# Patient Record
Sex: Male | Born: 1950 | Hispanic: No | Marital: Married | Smoking: Never smoker
Health system: Southern US, Community
[De-identification: ages and names within clinical notes are randomized; demographics above are authoritative.]

## PROBLEM LIST (undated history)

## (undated) DIAGNOSIS — I712 Thoracic aortic aneurysm, without rupture, unspecified: Secondary | ICD-10-CM

## (undated) DIAGNOSIS — I723 Aneurysm of iliac artery: Secondary | ICD-10-CM

## (undated) DIAGNOSIS — I10 Essential (primary) hypertension: Secondary | ICD-10-CM

## (undated) DIAGNOSIS — Z9889 Other specified postprocedural states: Secondary | ICD-10-CM

## (undated) HISTORY — DX: Aneurysm of iliac artery: I72.3

## (undated) HISTORY — DX: Thoracic aortic aneurysm, without rupture: I71.2

## (undated) HISTORY — DX: Essential (primary) hypertension: I10

## (undated) HISTORY — DX: Thoracic aortic aneurysm, without rupture, unspecified: I71.20

## (undated) HISTORY — DX: Other specified postprocedural states: Z98.890

---

## 1994-12-04 HISTORY — PX: OTHER SURGICAL HISTORY: SHX169

## 2000-10-20 ENCOUNTER — Encounter: Payer: Self-pay | Admitting: Emergency Medicine

## 2000-10-20 ENCOUNTER — Emergency Department (HOSPITAL_COMMUNITY): Admission: EM | Admit: 2000-10-20 | Discharge: 2000-10-20 | Payer: Self-pay | Admitting: Emergency Medicine

## 2003-05-31 ENCOUNTER — Ambulatory Visit (HOSPITAL_BASED_OUTPATIENT_CLINIC_OR_DEPARTMENT_OTHER): Admission: RE | Admit: 2003-05-31 | Discharge: 2003-05-31 | Payer: Self-pay | Admitting: Otolaryngology

## 2003-08-19 ENCOUNTER — Ambulatory Visit (HOSPITAL_COMMUNITY): Admission: RE | Admit: 2003-08-19 | Discharge: 2003-08-19 | Payer: Self-pay | Admitting: Gastroenterology

## 2006-12-04 DIAGNOSIS — Z9889 Other specified postprocedural states: Secondary | ICD-10-CM

## 2006-12-04 HISTORY — DX: Other specified postprocedural states: Z98.890

## 2007-04-05 ENCOUNTER — Encounter: Admission: RE | Admit: 2007-04-05 | Discharge: 2007-04-05 | Payer: Self-pay | Admitting: Cardiovascular Disease

## 2007-04-09 ENCOUNTER — Ambulatory Visit (HOSPITAL_COMMUNITY): Admission: RE | Admit: 2007-04-09 | Discharge: 2007-04-09 | Payer: Self-pay | Admitting: Cardiovascular Disease

## 2007-04-12 ENCOUNTER — Ambulatory Visit (HOSPITAL_COMMUNITY): Admission: RE | Admit: 2007-04-12 | Discharge: 2007-04-12 | Payer: Self-pay | Admitting: Cardiovascular Disease

## 2007-05-16 ENCOUNTER — Ambulatory Visit: Payer: Self-pay | Admitting: Cardiothoracic Surgery

## 2007-07-15 ENCOUNTER — Encounter (INDEPENDENT_AMBULATORY_CARE_PROVIDER_SITE_OTHER): Payer: Self-pay | Admitting: Urology

## 2007-07-15 ENCOUNTER — Inpatient Hospital Stay (HOSPITAL_COMMUNITY): Admission: RE | Admit: 2007-07-15 | Discharge: 2007-07-17 | Payer: Self-pay | Admitting: Urology

## 2007-09-27 ENCOUNTER — Encounter: Admission: RE | Admit: 2007-09-27 | Discharge: 2007-09-27 | Payer: Self-pay | Admitting: Orthopedic Surgery

## 2007-11-21 ENCOUNTER — Ambulatory Visit: Payer: Self-pay | Admitting: Cardiothoracic Surgery

## 2007-11-21 ENCOUNTER — Encounter: Admission: RE | Admit: 2007-11-21 | Discharge: 2007-11-21 | Payer: Self-pay | Admitting: Cardiothoracic Surgery

## 2008-01-27 ENCOUNTER — Encounter: Admission: RE | Admit: 2008-01-27 | Discharge: 2008-01-27 | Payer: Self-pay | Admitting: Urology

## 2008-11-19 ENCOUNTER — Ambulatory Visit: Payer: Self-pay | Admitting: Cardiothoracic Surgery

## 2010-12-25 ENCOUNTER — Encounter: Payer: Self-pay | Admitting: Cardiothoracic Surgery

## 2011-04-18 NOTE — Consult Note (Signed)
NEW PATIENT CONSULTATION   Michael Barron, Michael Barron  DOB:  09-04-1951                                        May 16, 2007  CHART #:  [id number]   CONSULTATION:  This is an office consultation requested by Dr. Nanetta Batty.   Followup cardiologist is Dr. Allyson Sabal.  Primary care physician is Dr.  Larina Bras.  Urologist is Dr. Earlene Plater.   REASON FOR CONSULTATION:  Dilated aortic root.   HISTORY OF PRESENT ILLNESS:  Patient is a 60 year old male who comes to  the office for a dilated aortic root after a convoluted series of  studies incidentally revealed this.  Initially the patient, because of  his new diagnosis of diabetes, history of hypertension, discussed with  his primary care physician whether he should have a stress test.  A  Myoview stress test was performed by Dr. Allyson Sabal, which was suspicious for  leading to a cardiac catheterization because of a moderate-sized scar in  the right coronary distribution.  Cardiac catheterization was performed  by Dr. Allyson Sabal which showed a mildly dilated ascending aorta, which then  led to a CT scan of the chest, abdomen and pelvis.  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  The aorta was dilated to 4.5 cm just above the aortic  root.  Because of this dilatation of the ascending aorta, the patient  was referred to see me.   CARDIAC RISK FACTORS:  Patient has a history of hypertension, history of  hyperlipidemia with total cholesterol of 299, history of newly diagnosed  type 2 diabetes, hemoglobin A1c as high as 10, most recent one is 7.  No  history of smoking.   FAMILY HISTORY:  Significant for father who died at age 43 of liver  carcinoma.  Mother died at age 68 of uterine cancer.  One sister died at  age 73 with breast cancer metastatic to the liver.   Patient has had no previous stroke.  Denies  claudication.  Denies renal  insufficiency.  Denies any other medical problems.   PAST SURGICAL HISTORY:  Previous surgery history includes a herniated  disk, cervical, requiring surgery on C5-6 in 1996.  History of  tonsillectomy in the past.   SOCIAL HISTORY:  Patient is married, employed as a Financial planner,  primarily works at [employer] work.  Has occasional alcohol use, not daily.   MEDICATIONS:  He is currently on Diovan 160/12.5, verapamil 360 mg,  metformin 500 mg twice a day, Glyburide 10 mg a day, Nexium p.r.n.,  simvastatin 20 mg a day, Lopressor 25 mg a day, __________ one gram per  day, and one aspirin a day.   ALLERGIES:  TRI-COR which caused rash after 1-2 days of use, also rash  with AMOXICILLIN and MINOCYCLINE.   REVIEW OF SYSTEMS:  CARDIAC:  He does have atypical chest discomfort  especially on the left lower chest.  Denies resting shortness of breath.  Does have exertional shortness of breath.  Denies orthopnea.  Has  occasional presyncopal episodes.  No history of syncope.  No history of  palpitations.  No lower extremity edema.  GENERAL:  Since the diagnosis  of diabetes patient has been making an effort  to lose weight.  He has  gone from 309 down to 245.  OTHER:  He does have reflux symptoms.  Denies blood in his stool.  Denies black stools.  Has had some history  of lightheadedness and dizziness.  No syncope or seizures.  Does  complain of arthritis.  Denies psychiatric history.  Denies amaurosis or  TIAs.  He has had no blood in his urine.  He did note spontaneous  bruising on the left eyelid over the past several days.   PHYSICAL EXAMINATION:  VITAL SIGNS:  Blood pressure 104/68, pulse 56 and  regular, respiratory rate 18, O2 sat is 98%.  GENERAL:  Patient is awake, alert, neurologically intact.  NECK:  He has no carotid bruits.  LUNGS:  Clear bilaterally.  CARDIAC:  Exam reveals regular rate at approximately 60 without murmur  or gallop.  ABDOMEN:  Benign,  without palpable masses.  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  EXTREMITIES:  He has no pedal edema.  Pedal pulses are 2+ bilaterally.   The patient's CT scan findings and cath labs findings are reviewed.   IMPRESSION:  1. Patient with suspicious lesion, 2 cm, for carcinoma of the left      kidney.  2. Very small, approximately 3-mm, smooth nodule in the right lung,      too small to characterize, but in the setting of possible renal      cell carcinoma may be of significance.  Patient has a slightly      dilated ascending aorta with a maximum diameter of 4.5 without      evidence of aortic insufficiency.   SUGGESTIONS:  The patient over the last several months has heeded his  medical advice with good treatment of his diabetes, lost weight, and is  on blood pressure medication that appears to be effective.  At this  point, with ascending aorta of 4.5 cm, patient does not warrant major  surgical intervention for this at this time.  I have discussed with him  the potential risks of dilated ascending aorta with spontaneous  dissections and stressed the importance of good blood pressure control  with some beta blocker.  At this point the greatest concern may be his  left renal mass and this should proceed.  I have made the patient a  return appointment to see me with a followup CT scan of the chest in six  month to check on the size of his aorta and also to evaluate any change  in size of the approximately 3-mm nodule in the right lung.  I discussed  the abdominal iliac artery aneurysm with the patient but will leave it  up to Dr. Allyson Sabal to follow this or refer him to a vascular surgeon.   I will send a copy of this to Dr. Darvin Neighbours, who the patient is to see  in the coming weeks concerning his question of renal carcinoma.  I plan  to see him back again in six months with a followup CT scan of the  chest.   Sheliah Plane, MD  Electronically Signed   EG/MEDQ  D:   05/16/2007  T:  05/17/2007  Job:  [id number]   cc:   Nanetta Batty, M.D.  Ace Gins, MD  Lucrezia Starch. Earlene Plater, M.D.

## 2011-04-18 NOTE — Discharge Summary (Signed)
NAMECAROLINA, ANTOLIN NO.:  [id number]   MEDICAL RECORD NO.:  [id number]          PATIENT TYPE:  INP   LOCATION:  1427                         FACILITY:  [facility]   PHYSICIAN:  Heloise Purpura, MD      DATE OF BIRTH:  May 25, 1951   DATE OF ADMISSION:  07/15/2007  DATE OF DISCHARGE:  07/17/2007                               DISCHARGE SUMMARY   ADMISSION DIAGNOSIS:  Left renal mass.   DISCHARGE DIAGNOSIS:  Left renal mass.   HISTORY:  For full details, please see admission history and physical.  Briefly, Mr. Bauserman is a 60 year old gentleman who was found to have  an incidentally detected left renal mass.  He underwent a metastatic  evaluation which was negative.  After discussing options, he elected to  proceed with a minimally invasive partial nephrectomy.   HOSPITAL COURSE:  On 07/15/2007, the patient was taken to the operating  room and underwent a robotic assisted left laparoscopic partial  nephrectomy.  He tolerated this procedure well and without  complications.  Postoperatively, he was able to be transferred to a  regular hospital room following recovery from anesthesia.  He was  maintained on strict bedrest for 24 hours and remained hemodynamically  stable.  His hematocrit on postoperative day #1 was found to be stable  at 37.8.  He then began ambulating, and his Foley catheter was  discontinued.  He maintained excellent urine output with minimal output  from his perinephric drain.  His diet was gradually advanced until he  was tolerating a regular diet on postoperative day #2.  His perinephric  drain fluid was sent for a creatinine level and was found to be  consistent with serum at 1.3.  It was therefore removed.  The patient  did have return of bowel function and was able to be discharged home in  excellent condition on postoperative day #2.   DISPOSITION:  Home.   DISCHARGE MEDICATIONS:  The patient was instructed to resume his regular  home  medications except for his aspirin and multivitamins.  He was given  a prescription to take Vicodin as needed for pain as well as to use  Colace as a stool softener.   DISCHARGE INSTRUCTIONS:  The patient was instructed to be ambulatory but  specifically told to refrain from any heavy lifting, strenuous activity,  or driving.  He was instructed on signs and symptoms of wound infection  and told to call, should he have any problems.   FOLLOW UP:  Mr. Counsell will follow-up in 2 weeks to discuss his  surgical pathology in detail and for removal of staples.      Heloise Purpura, MD  Electronically Signed    LB/MEDQ  D:  07/17/2007  T:  07/18/2007  Job:  [id number]

## 2011-04-18 NOTE — Op Note (Signed)
NAMEJASHUA, KNAAK NO.:  [id number]   MEDICAL RECORD NO.:  [id number]          PATIENT TYPE:  INP   LOCATION:  1427                         FACILITY:  [facility]   PHYSICIAN:  Heloise Purpura, MD      DATE OF BIRTH:  Oct 01, 1951   DATE OF PROCEDURE:  07/15/2007  DATE OF DISCHARGE:                               OPERATIVE REPORT   PREOPERATIVE DIAGNOSIS:  Left renal mass.   POSTOPERATIVE DIAGNOSIS:  Left renal mass.   PROCEDURE:  Robotic assisted laparoscopic left partial nephrectomy.   SURGEON:  Dr. Heloise Purpura.   ASSISTANT:  Dr. Tarri Glenn.   ANESTHESIA:  General.   COMPLICATIONS:  None.   ESTIMATED BLOOD LOSS:  175 mL.   INTRAVENOUS FLUIDS:  2750 mL of lactated Ringer's.   INTRAOPERATIVE FINDINGS:  Renal parenchymal margins were negative.   Warm ischemia time 23 minutes.   PATHOLOGIC SPECIMEN:  1. Renal parenchymal margin.  2. Left renal mass.   DISPOSITION:  Specimen to pathology.   DRAINS:  1. 16-French Foley catheter.  2. #15 Blake perinephric drain.   INDICATIONS:  Mr. Penick is a 60 year old gentleman who was found to  have an incidental 2 cm enhancing left renal mass.  He underwent  metastatic evaluation which was negative.  After discussing management  options for clinically localized small renal masses, the patient elects  to proceed with surgical removal and a minimally invasive partial  nephrectomy.  After discussing the risks, potential complications and  alternative management options, the patient elected to proceed with the  above procedure.  Informed consent was obtained.   DESCRIPTION OF PROCEDURE:  The patient was taken to the operating room  and a general anesthetic was administered.  He is given preoperative  antibiotics, placed in the left modified flank position, prepped and  draped in the usual sterile fashion.  Next a preoperative time-out was  performed.  A site was then selected to the left of the umbilicus for  placement of the camera port.  This was placed using a standard open  Hasson technique.  This allowed entry into the peritoneal cavity under  direct vision without difficulty.  A 12 mm port was then placed and a  pneumoperitoneum was established.  A 0 degrees lens was then used to  inspect the abdomen and there is no evidence of any intra-abdominal  injuries or other abnormalities.  The remaining ports were then placed.  An 8 mm robotic port was placed in the left upper quadrant.  An  additional 8 mm robotic port was then placed in the left lower quadrant.  An additional 8 mm robotic port was then placed just inferior to the  camera port and medial to the port in the left lower quadrant.  A 12 mm  port was placed for laparoscopic assistance between the camera port and  the left upper quadrant port.  All ports placed under direct vision and  without difficulty.  The 30 degrees lens was then utilized and the  surgical cart was docked.  With the aid of cautery scissors, the white  line  of Toldt was incised along the length of the descending colon  allowing the space between the colonic mesentery and the anterior layer  of Gerota's fascia to be developed.  The ureter and gonadal vein were  identified and lifted anteriorly off the psoas muscle and dissection  continued toward the renal hilum.  The patient was noted on preoperative  imaging to have two renal arteries.  On inspection of his hilum, there  was noted to be a small renal artery that appeared to be much more  inferior then the renal hilar arteries that were noted on his  preoperative imaging.  Just superior to this artery was noted to be a  large lumbar vein as well as an additional gonadal vein.  These were  ligated between multiple Hem-o-lok clips.  This allowed dissection up to  the main renal vein which was identified.  Posterior to the main renal  vein, two renal arteries were identified as seen on preoperative  imaging.  These  arteries were isolated and prepared for renal hilar  clamping.  12.5 grams of intravenous mannitol was then administered.  Attention then turned to the kidney and Gerota's fascia was incised  allowing the renal parenchyma to be exposed.  The patient's renal tumor  off the lateral aspect of the upper pole was easily identifiable.  Once  the perinephric fat along the edges of the tumor was removed and the  tumor isolated, the renal capsule was scored circumferentially around  the tumor.  A Surgicel bolster and 2-0 Vicryl suture were placed into  the abdomen in preparation for renal parenchymal repair.  At this point  bulldog clamps were placed on the two superior arteries of the kidney.  Using the round tip scissors, the renal tumor was then excised with a  grossly negative parenchymal margin.  The tumor was then placed in the  Endopouch retrieval bag for later removal.  The renal defect was then  examined.  Two deep margins were removed for intraoperative analysis and  returned negative for malignancy subsequently.  A figure-of-eight 4-0  Vicryl suture was placed to secure any bleeding vessels.  These sutures  were secured with a Laparotie.  A Surgicel bolster which had been  previously placed into the abdomen was then placed into the defect.  A 2-  0 Vicryl suture was then used to go ahead and reapproximated the renal  capsule over the Surgicel bolster.  Prior to securing the bolster, 4 mL  of was placed into the parenchymal defect for hemostatic purposes.  There appeared to be good compression of the renal parenchyma and  hemostasis appeared excellent.  The bulldog clamps were then removed.  Total warm ischemia time was 23 minutes.  12.5 grams of intravenous  mannitol was then again administered.  The renal hilum was examined and  there appeared to be no evidence of any bleeding.  The resected tumor  site was also examined and hemostasis appeared excellent.  Additional  Vitagel was placed  over the defect and the perinephric fat was  reapproximated over the resection site with remaining 2-0 Vicryl suture.  All needle, all needles were removed from the abdomen.  A #15 Blake  drain was brought to the left lower quadrant robotic port appropriately  positioned in the perinephric space and secured to skin with a nylon  suture.  The pneumoperitoneum was let down and hemostasis remained  excellent.  The surgical cart was then undocked and preparations were  made for closure.  The two 12 mm port sites were closed with 0 Vicryl  sutures placed with the aid of the suture passer device as well as  figure-of-eight 0 Vicryl sutures on the anterior fascia.  All ports were  removed under direct vision.  The pneumoperitoneum was expelled.  0.25%  Marcaine  was injected into all incision sites which were then reapproximated at  the skin level with staples.  Sterile dressings were applied.  The  patient appeared to tolerate the procedure well without complications.  He was able to be extubated and transferred to the recovery unit in  satisfactory condition.      Heloise Purpura, MD  Electronically Signed     LB/MEDQ  D:  07/15/2007  T:  07/16/2007  Job:  [id number]

## 2011-04-18 NOTE — Cardiovascular Report (Signed)
NAME:  ZYHEIR, DAFT NO.:  [id number]   MEDICAL RECORD NO.:  [id number]          PATIENT TYPE:  OIB   LOCATION:  2854                         FACILITY:  [facility]   PHYSICIAN:  Nanetta Batty, M.D.   DATE OF BIRTH:  24-Mar-1951   DATE OF PROCEDURE:  DATE OF DISCHARGE:                            CARDIAC CATHETERIZATION   Michael Barron is a 60 year old moderately overweight married white male  father of one, grandfather of two grandchildren, referred by [facility] (Dr. Ace Gins).  He had a nuclear stress test in  our office performed April 10.  This showed inferolateral scar with mild  peri-infarct ischemia.  His risk factors include hypertension, diabetes  and hyperlipidemia.  He has noticed chest pain at approximately 1 to 2  times a month.  He presents now for outpatient diagnostic coronary  arteriography to define his anatomy and rule out an ischemic etiology.   DESCRIPTION OF PROCEDURE:  The patient was brought to the second floor,  [facility] in a postabsorptive state.  He was pre-  medicated with p.o. Valium.  His right groin was prepped and shaved in  the usual sterile fashion.  Xylocaine 1% was used for  local anesthesia.  A 6-French sheath was inserted to the right femoral artery using  standard Seldinger technique.  A 6-French right and left Judkins'  diagnostic catheter, as well as 6-French pigtail catheter and IMA  catheter were used for selective coronary angiography, left  ventriculography, supravalvular aortography, distal abdominal  aortography and selective left iliac angiography.  Visipaque dye was  used for the entirety of the case.  Retrograde aortic, left ventricular  and pulmonary pressures were recorded.   HEMODYNAMIC RESULTS:  1. Aortic systolic pressure 135, diastolic pressure 61.  2. Left ventricular systolic pressure 133, end-diastolic pressure 16.   SELECTIVE CORONARY ANGIOGRAPHY:  1. Left main  normal.  2. LAD normal.  3. Left circumflex normal.  4. Right coronary artery was dominant and normal.  5. Left ventriculography; RAO left ventriculogram was performed using      25 mL of Visipaque dye at 12 mL per second.  The overall was LVEF      estimated approximately 50% with mild global hypokinesia.  6. Supravalvular aortography; performed in the LAO view using 3 mL of      Visipaque dye at 20 mL per second.  The aortic root appeared      moderately dilated.  There as no AI.  It narrowed down in the      transverse arch, past the left common carotid.  Arch vessels      appeared intact.  7. Distal abdominal aortography.  Distal abdominal aortogram was      performed using 20 mL of Visipaque dye at 20 mL per second.  The      large was widely patent.  Infra-renal abdominal aorta was somewhat      tortuous, as with the iliac arteries.  8. Selective angiography was performed of the left iliac, revealing an      egg-shaped mid-left common iliac artery  aneurysm.   IMPRESSION:  Michael Barron has essentially normal coronary arteries, low  normal LV function and a Myoview that was probably abnormal, related to  diaphragmatic attenuation.  I am somewhat concerned about his aortic  root, as well as his left common iliac.  He will need CT angio to his  accurately size these.  Continued medical therapy will be recommended.   Sheath was removed and pressure was held on the groin to achieve  hemostasis.  The patient left the lab in stable condition.      Nanetta Batty, M.D.  Electronically Signed     JB/MEDQ  D:  04/09/2007  T:  04/09/2007  Job:  [id number]   cc:   Patient Chart  [facility] -2nd Floor  [facility]  Ace Gins, MD  [facility]

## 2011-04-18 NOTE — Assessment & Plan Note (Signed)
OFFICE VISIT   BABY, STAIRS  DOB:  [birth year]                                        November 19, 2008  CHART #:  [id number]   The patient comes to the office today.  I had originally seen in June  2008 when he was referred by Dr. Allyson Sabal for dilated ascending aorta.  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  His ascending  aorta was dilated to 4.5 cm.  Since originally seen, he has made major  efforts at weight reduction, losing weight from 310 down to average of  195 pounds.  In addition, he had his renal tumor resected.  He returns  today because in last appointment I saw him, I suggested a followup scan  to evaluate the rate of change of his ascending aorta.  He recently had  a CT scan done by the urologist done August 25, 2008, primarily to  evaluate for his followup for renal cell tumor, but a CT scan of the  chest was performed and the ascending aorta was measured by Radiology at  4.5 cm.  Unfortunately, actually view and images is locked in to the  Urology system, which I do not have access to, but a detailed report  from Radiology reveals a maximum diameter of 4.5 cm ascending aorta  which is unchanged and the maximum diameter of the left common iliac  artery is unchanged at 2.5 cm.  No other question of lung nodules and  the previous CT scan were unchanged.  In a vague area on the posterior  aspect of the left fourth rib was thought to have a benign appearance by  report.   Overall, the patient feels very well.  Notes that he feels better than  he has for many years with his weight loss.  Denies any angina or  evidence of heart failure.   PHYSICAL EXAMINATION:  VITAL SIGNS:  His blood pressure is 148/87, pulse  is 63, respiratory rate is 18, and O2 sats 97%.  LUNGS:  Clear bilaterally.  CARDIAC:  Regular rate and rhythm without murmur.  I do not  hear any  murmur of aortic insufficiency.  ABDOMEN:  Benign.  The left iliac aneurysm is not palpable.  EXTREMITIES:  He has pedal pulses.  No edema.   As noted above, he continues on verapamil 180 mg.  He is on lisinopril,  but was unsure of the dose, but admits that he does not take it on a  regular basis; also on aspirin 325 mg a day.  He is not on a beta-  blocker.   From the CT scan report from urologist's office CT appears that the  patient's ascending aorta is unchanged since he was originally seen and  is well within the range to continue to observe it rather than setting  up a definite scan in 1 year.  I have made him an appointment to see me  back in 1 year to reevaluate his overall situation and then decide on  timing of any scans that need to be done and we will try to coordinate  this with Urology, so duplicate scans were not performed.   Sheliah Plane, MD  Electronically Signed   EG/MEDQ  D:  11/19/2008  T:  11/19/2008  Job:  [id number]   cc:   Nanetta Batty, M.D.  Ace Gins, MD  Heloise Purpura, MD

## 2011-04-18 NOTE — Assessment & Plan Note (Signed)
OFFICE VISIT   Michael Barron, Michael Barron  DOB:  04/10/1951                                        November 21, 2007  CHART #:  [id number]   Patient returns today with a follow-up CT scan of the chest.  He was  last seen in June, 2008, referred by Dr. Allyson Sabal for a dilated ascending  aorta.  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  The patient  was also significantly overweight.   Since last seen, he has undergone robotic partial nephrectomy, which he  has done well form.  He has also made a very major effort at weight  control and is now off diabetic medications and has decreased his waist  from 48 inches to 38 inches.   PHYSICAL EXAMINATION:  His blood pressure is 132/88.  Pulse is 70.  Respiratory rate is 18.  O2 sat is 97%.  Patient looks well.  He has no  murmur of aortic insufficiency.  His lungs are clear bilaterally.  Abdominal exam reveals healed port sites from his nephrectomy, without  palpable masses or tenderness.  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  It is not palpable on  physical examination.   Patient returns today with a follow-up CT scan of the chest done at [facility].  The maximum transverse diameter of the aorta is 43 mm.  A 5 mm nodule in  the right middle lobe on image #34 is noted.  The other patchy densities  likely are some pneumonitis.  This small nodule on the right lung  appears unchanged from previous scans.  The aorta is not changed in  size.   This patient has made a dramatic change in his lifestyle with weight  loss, diabetes control, and hypertension.  We will plan to have a follow-  up CT scan to evaluate the size of his aorta in one year.  The small  lung nodule noted on previous scans do not appear to be changing.  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  Dr. Allyson Sabal has been following the patient and will leave it up  to his discretion following this with vascular ultrasound and  referral to a vascular surgeon at his discretion.  Plan to see the  patient back in one year with a follow-up CT scan of the chest.   Sheliah Plane, MD  Electronically Signed   EG/MEDQ  D:  11/21/2007  T:  11/22/2007  Job:  [id number]   cc:   Nanetta Batty, M.D.  Heloise Purpura, MD  Ace Gins, MD

## 2011-04-18 NOTE — H&P (Signed)
NAMEADIL, TUGWELL NO.:  [id number]   MEDICAL RECORD NO.:  [id number]          PATIENT TYPE:  INP   LOCATION:  1427                         FACILITY:  [facility]   PHYSICIAN:  Heloise Purpura, MD      DATE OF BIRTH:  01-Sep-1951   DATE OF ADMISSION:  07/15/2007  DATE OF DISCHARGE:                              HISTORY & PHYSICAL   CHIEF COMPLAINT:  Left renal mass.   HISTORY:  Mr. Veal is a 60 year old gentleman who was found to have  an incidentally detected 2 cm enhancing left renal mass worrisome for  possible renal malignancy.  This was found on a CT angiogram that was  performed for further evaluation of an aortic aneurysm.  The patient's  thoracic aortic aneurysm was felt to be stable and after being evaluated  by Dr. Sheliah Plane, the decision was made to proceed with medical  management and surveillance of his aneurysm.  The patient also underwent  a cardiac evaluation by Dr. Nanetta Batty and the patient was felt to  be at low cardiovascular risk for surgical removal of his renal mass.  After discussing options and undergoing a negative metastatic  evaluation, the patient elects to proceed with a minimally invasive  partial nephrectomy.   PAST MEDICAL HISTORY:  1. Diabetes.  2. Obstructive sleep apnea.  3. Hyperlipidemia.  4. Hypertension.  5. Gastroesophageal reflux disease.  6. History of myocardial infarction.   PAST SURGICAL HISTORY:  1. Surgery for herniated cervical disk in 1997.  2. Excision of pilonidal cyst   MEDICATIONS:  1. Diovan  2. Fexofenadine  3. Glipizide.  4. Metformin.  5. Nexium.  6. Simvastatin.  7. Toprol  8. Verapamil   ALLERGIES:  1. PENICILLIN which causes a rash.  2. MINOCIN which causes a rash.  3. TRICOR which causes itching.   FAMILY HISTORY:  No history of GU malignancy.  There is a maternal  history of cervical cancer.  The patient's mother died at age 16 due to  cervical cancer.  He also  a sister  who died of breast cancer at a young  age.  The patient's father also died of liver cancer at age 66.   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  He is married.  He denies tobacco use.  Drinks  alcohol only occasionally.   REVIEW OF SYSTEMS:  Negative except for occasional fatigue, shortness of  breath, back pain, heartburn, dizziness and anxiety.  The patient also  has had significant weight loss intentionally over the last 10 months  and lost approximately 60 pounds.   PHYSICAL EXAM:  CONSTITUTIONAL:  Alert and oriented in no acute  distress.  CARDIOVASCULAR:  Regular rate and rhythm without obvious murmurs.  LUNGS:  Clear bilaterally.  ABDOMEN:  Mildly obese but soft, nontender, nondistended without  abdominal masses.  EXTREMITIES:  No edema.   IMPRESSION:  Left renal mass concerning for malignancy.   PLAN:  Mr. Kotowski will undergo a robotic assisted laparoscopic partial  nephrectomy of his left renal mass.  We have previously discussed the  risks and possible complications as well as the potential need for total  nephrectomy or open surgical conversion.  The patient will be admitted  to the hospital postoperatively for routine postoperative care.      Heloise Purpura, MD  Electronically Signed     LB/MEDQ  D:  07/15/2007  T:  07/15/2007  Job:  [id number]

## 2011-04-21 NOTE — Op Note (Signed)
   NAME:  Michael Barron, Michael Barron                         ACCOUNT NO.:  [id number]   MEDICAL RECORD NO.:  [id number]                   PATIENT TYPE:  AMB   LOCATION:  ENDO                                 FACILITY:  [facility]   PHYSICIAN:  Anselmo Rod, M.D.               DATE OF BIRTH:  08/03/1951   DATE OF PROCEDURE:  08/19/2003  DATE OF DISCHARGE:                                 OPERATIVE REPORT   PROCEDURE:  Colonoscopy.   ENDOSCOPIST:  Anselmo Rod, M.D.   INSTRUMENT USED:  Olympus video colonoscope.   INDICATIONS FOR PROCEDURE:  A 60 year old white male underwent screening  colonoscopy, rule out colonic polyps, masses, etc.   PREPROCEDURE PREPARATION:  Informed consent was procured from the patient.  The patient was fasted for eight hours prior to the procedure and prepped  with a bottle of magnesium citrate and a gallon of GoLYTELY the night prior  to the procedure.   PREPROCEDURE PHYSICAL EXAMINATION:  VITAL SIGNS: Stable.  NECK: Supple.  CHEST:  Clear to auscultation. S1 and S2 regular.  ABDOMEN: Soft with normal bowel sounds.   DESCRIPTION OF PROCEDURE:  The patient was placed in the left lateral  decubitus position and sedated with 100 mg of Demerol and 10 mg of Versed in  slow incremental doses.  Once the patient was adequately sedated, maintained  on low flow oxygen, and continuous cardiac monitoring, the Olympus video  colonoscope was advanced from the rectum to the cecum and terminal ileum  without difficulty.  The patient had an excellent prep.  No masses, polyps,  erosions, ulcerations, or diverticula were seen.  Retroflexion in the rectum  revealed small nonbleeding internal hemorrhoids. The appendiceal orifice and  ileocecal valve were clearly visualized and photographed. The patient  tolerated the procedure well without complications.   IMPRESSION:  Normal colonoscopy up to the terminal ileum except for small  internal hemorrhoids. No masses, polyps, or  diverticulosis noted.    RECOMMENDATIONS:  Repeat colorectal cancer screening is recommended in the  next 10 years unless the patient develops any abnormal symptoms in the  interim.  High fiber diet with liberal fluid intake has been advocated.  Outpatient follow-up as need arises in the future.                                               Anselmo Rod, M.D.    JNM/MEDQ  D:  08/19/2003  T:  08/19/2003  Job:  [id number]   cc:   Teena Irani. Arlyce Dice, M.D.  [address]  [place]  [place] [address]  Fax: [phone]

## 2011-09-18 LAB — TYPE AND SCREEN
ABO/RH(D): A POS
Antibody Screen: NEGATIVE

## 2011-09-18 LAB — BASIC METABOLIC PANEL WITH GFR
BUN: 11
BUN: 15
CO2: 26
CO2: 27
Calcium: 8.3 — ABNORMAL LOW
Calcium: 8.6
Chloride: 104
Chloride: 106
Creatinine, Ser: 1.34
Creatinine, Ser: 1.38
GFR calc non Af Amer: 53 — ABNORMAL LOW
GFR calc non Af Amer: 55 — ABNORMAL LOW
Glucose, Bld: 135 — ABNORMAL HIGH
Glucose, Bld: 99
Potassium: 4.2
Potassium: 4.3
Sodium: 139
Sodium: 139

## 2011-09-18 LAB — URINALYSIS, ROUTINE W REFLEX MICROSCOPIC
Nitrite: NEGATIVE
Specific Gravity, Urine: 1.022
Urobilinogen, UA: 0.2

## 2011-09-18 LAB — BASIC METABOLIC PANEL
Calcium: 9.5
GFR calc Af Amer: >60
GFR calc non Af Amer: >60
Glucose, Bld: 67 — ABNORMAL LOW
Sodium: 145

## 2011-09-18 LAB — HEMOGLOBIN AND HEMATOCRIT, BLOOD
HCT: 37.8 — ABNORMAL LOW
Hemoglobin: 12.9 — ABNORMAL LOW

## 2011-09-18 LAB — CBC
Hemoglobin: 14.2
RDW: 14.4 — ABNORMAL HIGH

## 2011-09-18 LAB — ABO/RH: ABO/RH(D): A POS

## 2012-01-02 ENCOUNTER — Ambulatory Visit (HOSPITAL_COMMUNITY)
Admission: RE | Admit: 2012-01-02 | Discharge: 2012-01-02 | Disposition: A | Discharge status: Home / Self Care | Payer: 59 | Source: Ambulatory Visit | Attending: Urology | Admitting: Urology

## 2012-01-02 ENCOUNTER — Other Ambulatory Visit (HOSPITAL_COMMUNITY): Payer: Self-pay | Admitting: Urology

## 2012-01-02 DIAGNOSIS — C649 Malignant neoplasm of unspecified kidney, except renal pelvis: Secondary | ICD-10-CM

## 2012-01-02 DIAGNOSIS — I10 Essential (primary) hypertension: Secondary | ICD-10-CM | POA: Insufficient documentation

## 2014-06-17 ENCOUNTER — Other Ambulatory Visit: Payer: Self-pay | Admitting: Family Medicine

## 2014-06-17 DIAGNOSIS — Z9889 Other specified postprocedural states: Secondary | ICD-10-CM

## 2014-06-22 ENCOUNTER — Other Ambulatory Visit: Payer: 59

## 2015-03-11 ENCOUNTER — Telehealth: Payer: Self-pay | Admitting: Internal Medicine

## 2015-03-11 NOTE — Telephone Encounter (Signed)
Received records from [place] for appointment with Dr Debara Pickett on 04/15/15. Records given to [facility] (medical records) for Dr Lysbeth Penner schedule on 04/15/15.  lp

## 2015-04-15 ENCOUNTER — Ambulatory Visit: Payer: Self-pay | Admitting: Internal Medicine

## 2015-06-03 ENCOUNTER — Ambulatory Visit: Payer: Self-pay | Admitting: Internal Medicine

## 2015-06-18 ENCOUNTER — Encounter: Payer: Self-pay | Admitting: Cardiovascular Disease

## 2015-06-18 ENCOUNTER — Ambulatory Visit (INDEPENDENT_AMBULATORY_CARE_PROVIDER_SITE_OTHER): Payer: 59 | Admitting: Cardiovascular Disease

## 2015-06-18 VITALS — BP 148/84 | HR 63 | Ht 71.0 in | Wt 229.0 lb

## 2015-06-18 DIAGNOSIS — I712 Thoracic aortic aneurysm, without rupture, unspecified: Secondary | ICD-10-CM | POA: Insufficient documentation

## 2015-06-18 DIAGNOSIS — I10 Essential (primary) hypertension: Secondary | ICD-10-CM | POA: Diagnosis not present

## 2015-06-18 DIAGNOSIS — Z79899 Other long term (current) drug therapy: Secondary | ICD-10-CM

## 2015-06-18 DIAGNOSIS — I723 Aneurysm of iliac artery: Secondary | ICD-10-CM | POA: Diagnosis not present

## 2015-06-18 LAB — BASIC METABOLIC PANEL
BUN: 29 mg/dL — AB (ref 6–23)
CO2: 28 mEq/L (ref 19–32)
CREATININE: 1.17 mg/dL (ref 0.50–1.35)
Calcium: 8.9 mg/dL (ref 8.4–10.5)
Chloride: 102 mEq/L (ref 96–112)
GLUCOSE: 89 mg/dL (ref 70–99)
Potassium: 4.2 mEq/L (ref 3.5–5.3)
Sodium: 141 mEq/L (ref 135–145)

## 2015-06-18 NOTE — Patient Instructions (Signed)
  We will see you back in follow up in 1 year with Dr Gwenlyn Found.  Dr Gwenlyn Found has ordered: CT Angio of the chest, abdomin, and pelvis. Please have blood work done prior to the CT.

## 2015-06-18 NOTE — Progress Notes (Signed)
06/18/2015 Michael Barron   Nov 03, 1951  [id number]  Primary Physician No primary care provider on file. Primary Cardiologist: Michael Harp MD Michael Barron   HPI:  Michael Barron is a 64 year old mildly overweight married Caucasian male whose wife Michael Barron is also a patient of mine. I last saw him in the office 01/15/2009. He has a history of normal coronary arteries by cath in 2008. 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 He had a 2 cm mass arising from the upper pole of his left kidney which was laparoscopically removed by Dr. Alinda Barron back in 2008. He also has a history of left common iliac artery aneurysm. The problems include hypertension and hyperlipidemia as well as non-insulin requiring diabetes. He had lost quite a bit overweight and I saw him last as result of diet. He does complain of some dyspnea on exertion although his skin 30 pounds since I saw him last. He had a stress echo performed at [facility] that showed some vague apical and septal stress-induced wall motion analysis the right do not think these were high-risk findings and he is relatively asymptomatic.   Current Outpatient Prescriptions  Medication Sig Dispense Refill  . aspirin 81 MG tablet Take 81 mg by mouth daily.    Marland Kitchen losartan-hydrochlorothiazide (HYZAAR) 50-12.5 MG per tablet      No current facility-administered medications for this visit.    Allergies  Allergen Reactions  . Minocin [Minocycline] Rash    History   Social History  . Marital Status: Married    Spouse Name: N/A  . Number of Children: N/A  . Years of Education: N/A   Occupational History  . Not on file.   Social History Main Topics  . Smoking status: Never Smoker   . Smokeless tobacco: Not on file  . Alcohol Use: Not on file  . Drug Use: Not on file  . Sexual Activity: Not on file   Other Topics Concern  . Not on file   Social History Narrative     Review of Systems: General:  negative for chills, fever, night sweats or weight changes.  Cardiovascular: negative for chest pain, dyspnea on exertion, edema, orthopnea, palpitations, paroxysmal nocturnal dyspnea or shortness of breath Dermatological: negative for rash Respiratory: negative for cough or wheezing Urologic: negative for hematuria Abdominal: negative for nausea, vomiting, diarrhea, bright red blood per rectum, melena, or hematemesis Neurologic: negative for visual changes, syncope, or dizziness All other systems reviewed and are otherwise negative except as noted above.    Blood pressure 148/84, pulse 63, height 5\' 11"  (1.803 m), weight 229 lb (103.874 kg).  General appearance: alert and no distress Neck: no adenopathy, no carotid bruit, no JVD, supple, symmetrical, trachea midline and thyroid not enlarged, symmetric, no tenderness/mass/nodules Lungs: clear to auscultation bilaterally Heart: regular rate and rhythm, S1, S2 normal, no murmur, click, rub or gallop Extremities: extremities normal, atraumatic, no cyanosis or edema  EKG normal sinus rhythm at 63 with left anterior fascicular block and nonspecific IVCD. I personally reviewed this EKG  ASSESSMENT AND PLAN:   Thoracic aortic aneurysm History of thoracic aortic aneurysm measuring 4.5 cm just above the sinotubular junction. This was followed by Dr. Servando Barron in the past. I'm going to repeat a CT angiogram to assess the size since we haven't checked this in quite a while.  Essential hypertension History of hypertension with blood pressure measured at 148/84. He is on losartan/hydrochlorothiazide. 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 We will check a pelvic CT to assess size      Michael Harp MD [facility] 06/18/2015 4:17 PM

## 2015-06-18 NOTE — Assessment & Plan Note (Signed)
History of left common iliac artery aneurysm. We will check a pelvic CT to assess size

## 2015-06-18 NOTE — Assessment & Plan Note (Signed)
History of thoracic aortic aneurysm measuring 4.5 cm just above the sinotubular junction. This was followed by Dr. Servando Snare in the past. I'm going to repeat a CT angiogram to assess the size since we haven't checked this in quite a while.

## 2015-06-18 NOTE — Assessment & Plan Note (Signed)
History of hypertension with blood pressure measured at 148/84. He is on losartan/hydrochlorothiazide. Continue current meds at current dosing

## 2015-06-21 ENCOUNTER — Encounter: Payer: Self-pay | Admitting: *Deleted

## 2015-06-28 ENCOUNTER — Encounter: Payer: Self-pay | Admitting: Cardiovascular Disease

## 2015-06-29 ENCOUNTER — Ambulatory Visit (INDEPENDENT_AMBULATORY_CARE_PROVIDER_SITE_OTHER)
Admission: RE | Admit: 2015-06-29 | Discharge: 2015-06-29 | Disposition: A | Discharge status: Home / Self Care | Payer: 59 | Source: Ambulatory Visit | Attending: Cardiovascular Disease | Admitting: Cardiovascular Disease

## 2015-06-29 DIAGNOSIS — I723 Aneurysm of iliac artery: Secondary | ICD-10-CM

## 2015-06-29 DIAGNOSIS — I712 Thoracic aortic aneurysm, without rupture, unspecified: Secondary | ICD-10-CM

## 2016-04-12 IMAGING — CT CT ANGIO CHEST
2 of 5 series · 18 of 46 positions shown · IV contrast (Omnipaque 300)
Comparison: 01/08/2012

CLINICAL DATA: Evaluate aneurysm below heart and right groin. Known
for 8 years with no treatment.

EXAM:
CT ANGIOGRAPHY CHEST, ABDOMEN AND PELVIS
TECHNIQUE: Multidetector CT imaging through the chest, abdomen and pelvis was
performed using the standard protocol during bolus administration of
intravenous contrast. Multiplanar reconstructed images and MIPs were
obtained and reviewed to evaluate the vascular anatomy.
CONTRAST:  100 cc Omnipaque 300 IV

[Series 6: thins 1.0mm / 0.8mm · axial · 0.81mm/px · z∈[-329,+304]mm · 15 of 695 slices shown]
[im 31/695  lung]
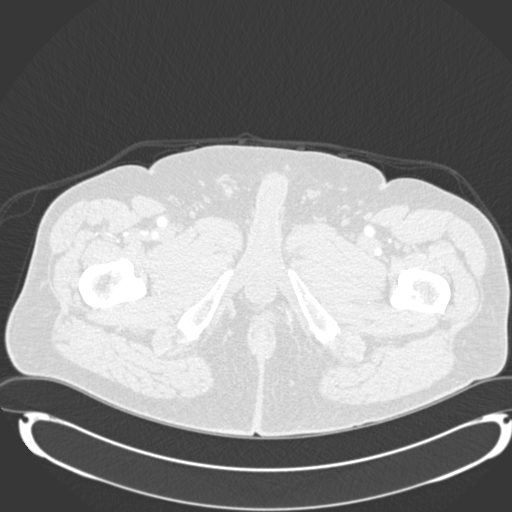
[im 91/695  soft-tissue]
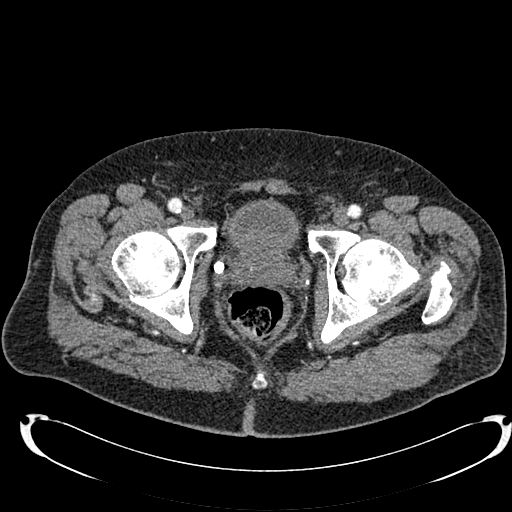
[im 121/695  lung]
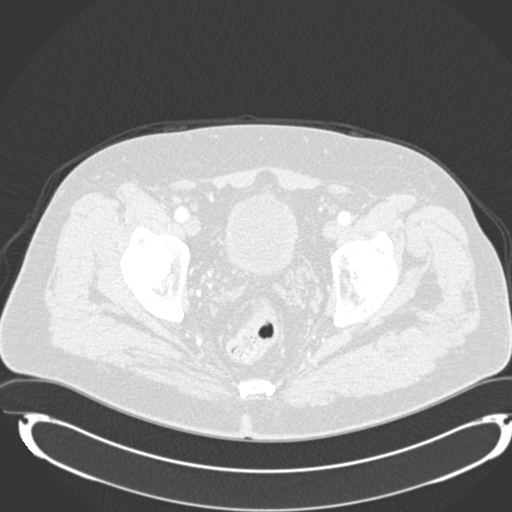
[im 182/695  soft-tissue]
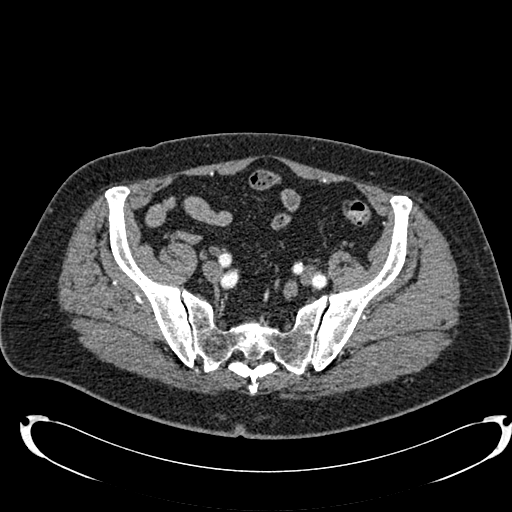
[im 212/695  lung]
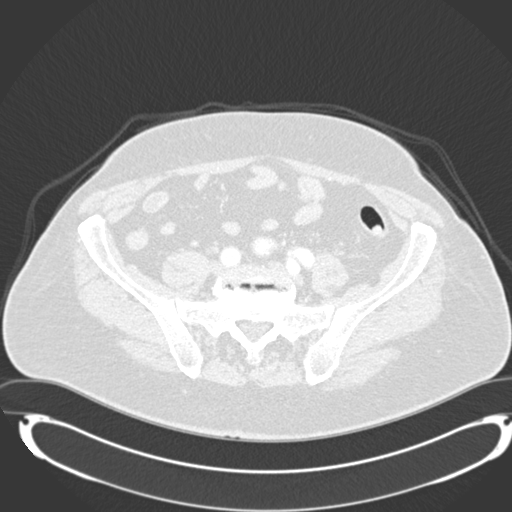
[im 272/695  soft-tissue]
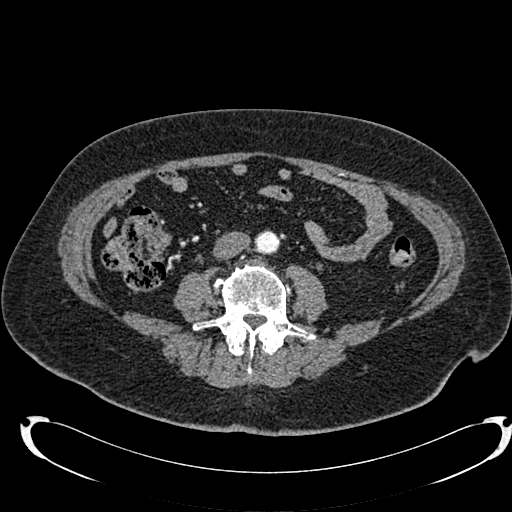
[im 302/695  lung]
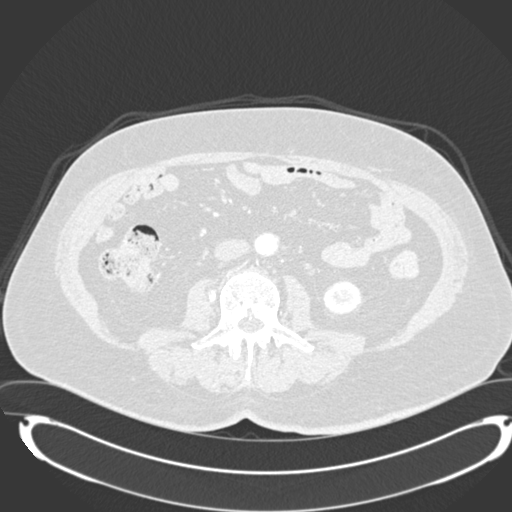
[im 363/695  soft-tissue]
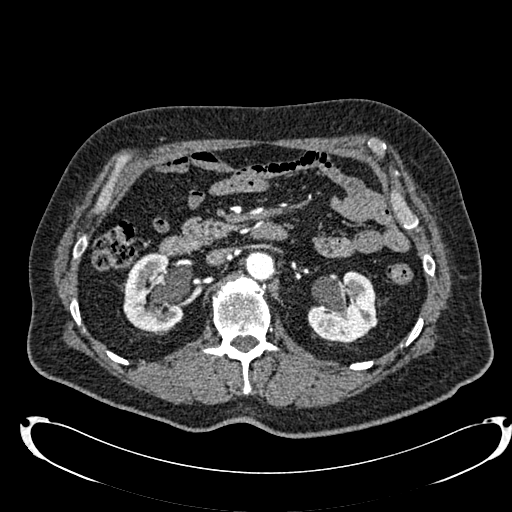
[im 393/695  lung]
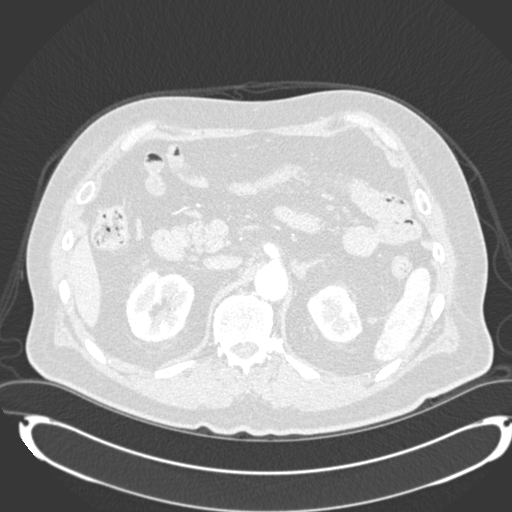
[im 423/695  soft-tissue]
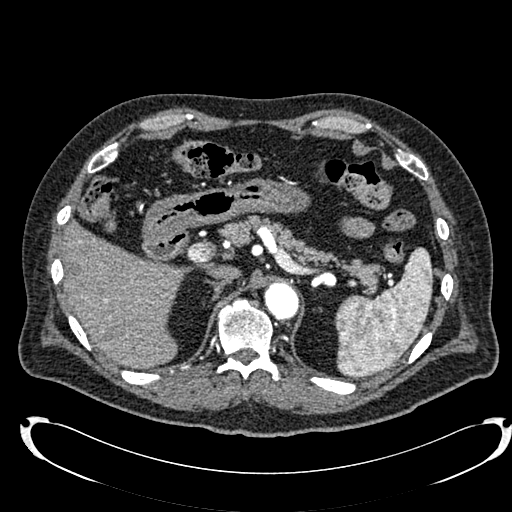
[im 483/695  lung]
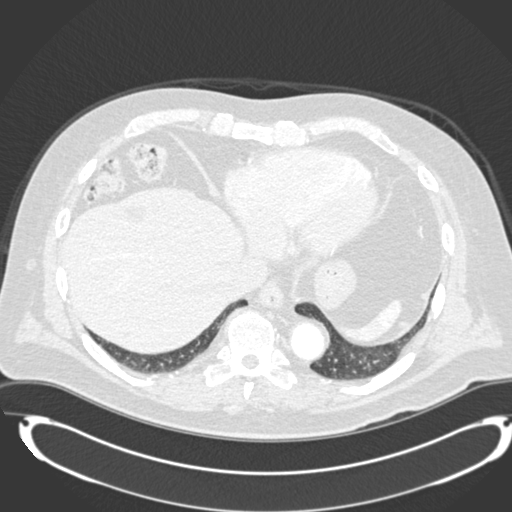
[im 513/695  soft-tissue]
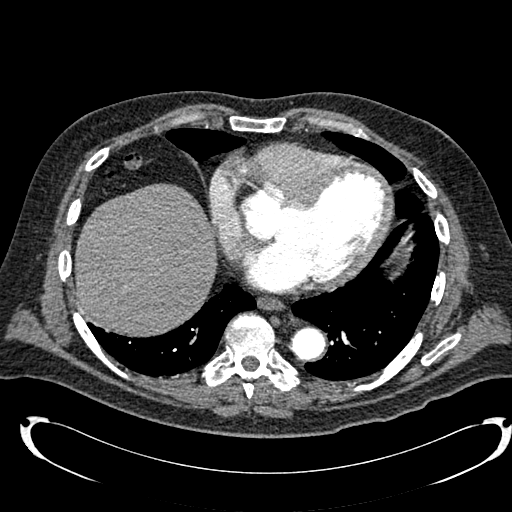
[im 574/695  lung]
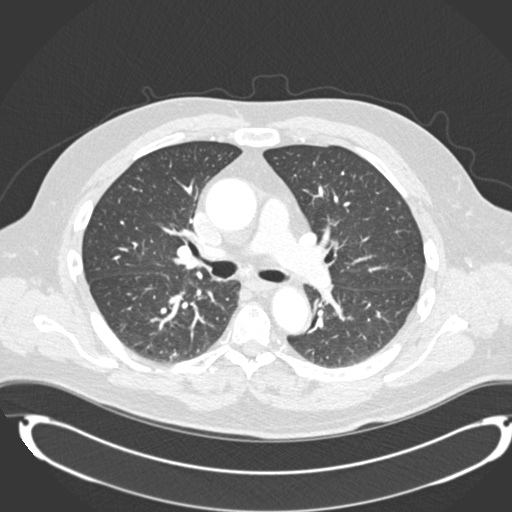
[im 604/695  soft-tissue]
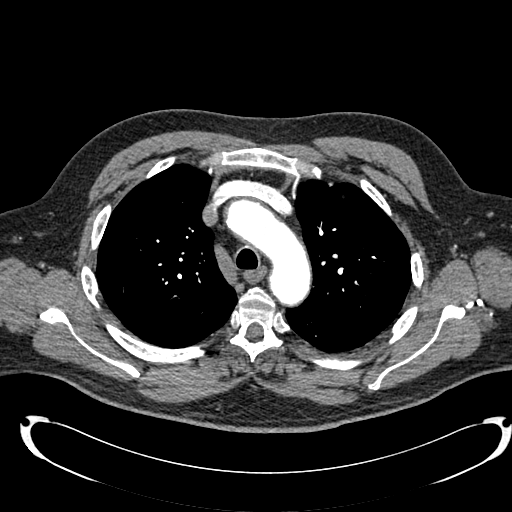
[im 664/695  lung]
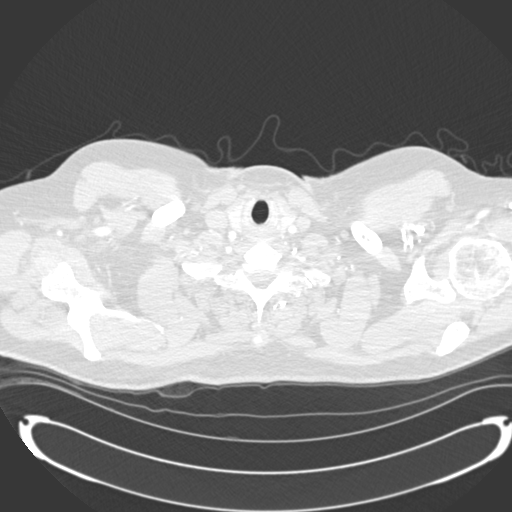

[Series 604: <mpr thick range(1)> · coronal · 1.35mm/px · 3 of 137 slices shown]
[im 46/137  soft-tissue]
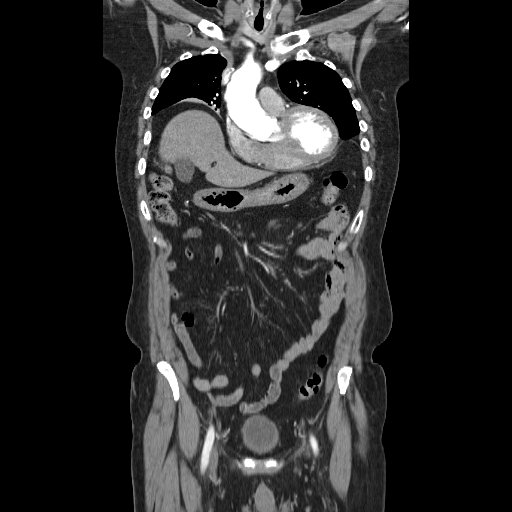
[im 61/137  soft-tissue]
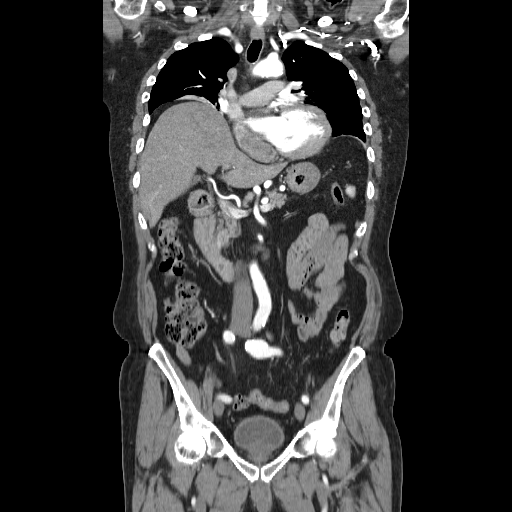
[im 76/137  soft-tissue]
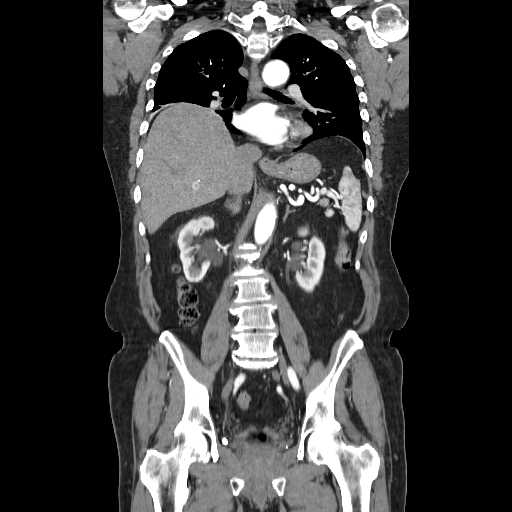

[18 of 46 positions shown; findings below may reference images not displayed]

FINDINGS: CTA CHEST FINDINGS

Mild aneurysmal dilatation of the ascending thoracic aorta, with
maximum diameter 4.3 cm in the mid ascending thoracic aorta. Aortic
arch and descending thoracic aorta are non aneurysmal. No
dissection. No visible coronary artery calcifications. Heart is
normal size.

No mediastinal, hilar, or axillary adenopathy. Chest wall soft
tissues are unremarkable. Elevation of the right hemidiaphragm with
minimal right base atelectasis or scarring, similar to prior study.
No effusions or confluent airspace opacities.

Review of the MIP images confirms the above findings.

CTA ABDOMEN AND PELVIS FINDINGS

Abdominal aorta is normal caliber with maximum diameter in the
proximal abdominal aorta at 2.5 cm. Tortuosity of the common iliac
arteries. Aneurysmal dilatation of the left common iliac artery at
2.6 cm, stable. External iliac arteries and common femoral arteries
are normal caliber. No dissection. Inferior mesenteric artery,
superior mesenteric artery, celiac artery, and 2 renal arteries
bilaterally are all widely patent.

Liver, gallbladder, spleen, pancreas, adrenals are unremarkable.
Scarring noted in the upper pole of the left kidney with cortical
thinning. Parapelvic cysts noted bilaterally within the kidneys. No
hydronephrosis. Urinary bladder is decompressed, grossly
unremarkable. Prostate grossly unremarkable.

Sigmoid diverticulosis. No active diverticulitis. Appendix is
visualized and is normal. Stomach and small bowel are decompressed
and grossly unremarkable. No free fluid, free air or adenopathy.

No acute bony abnormality or focal bone lesion. Degenerative changes
in the lumbar spine.

Review of the MIP images confirms the above findings.
IMPRESSION: Slight aneurysmal dilatation of the ascending thoracic aorta,
maximum diameter 4.3 cm. Stable dilatation of the left common iliac
artery at 2.6 cm.

Great vessels and mesenteric vessels/ renal arteries are widely
patent. There are 2 renal arteries bilaterally.

Bilateral renal parapelvic cysts. Cortical thinning and scarring in
the upper pole of left kidney.

Sigmoid diverticulosis.

## 2016-05-31 ENCOUNTER — Telehealth: Payer: Self-pay | Admitting: Cardiovascular Disease

## 2016-05-31 ENCOUNTER — Emergency Department (HOSPITAL_COMMUNITY): Payer: 59

## 2016-05-31 ENCOUNTER — Encounter (HOSPITAL_COMMUNITY): Payer: Self-pay

## 2016-05-31 ENCOUNTER — Emergency Department (HOSPITAL_COMMUNITY)
Admission: EM | Admit: 2016-05-31 | Discharge: 2016-05-31 | Disposition: A | Discharge status: Home / Self Care | Payer: 59 | Attending: Emergency Medicine | Admitting: Emergency Medicine

## 2016-05-31 DIAGNOSIS — Z79899 Other long term (current) drug therapy: Secondary | ICD-10-CM | POA: Insufficient documentation

## 2016-05-31 DIAGNOSIS — Z7982 Long term (current) use of aspirin: Secondary | ICD-10-CM | POA: Diagnosis not present

## 2016-05-31 DIAGNOSIS — R0789 Other chest pain: Secondary | ICD-10-CM | POA: Diagnosis not present

## 2016-05-31 DIAGNOSIS — I10 Essential (primary) hypertension: Secondary | ICD-10-CM | POA: Diagnosis not present

## 2016-05-31 DIAGNOSIS — K2970 Gastritis, unspecified, without bleeding: Secondary | ICD-10-CM | POA: Insufficient documentation

## 2016-05-31 DIAGNOSIS — Z85528 Personal history of other malignant neoplasm of kidney: Secondary | ICD-10-CM | POA: Diagnosis not present

## 2016-05-31 LAB — CBC
HCT: 43 % (ref 39.0–52.0)
Hemoglobin: 14.7 g/dL (ref 13.0–17.0)
MCH: 31.3 pg (ref 26.0–34.0)
MCHC: 34.2 g/dL (ref 30.0–36.0)
MCV: 91.5 fL (ref 78.0–100.0)
PLATELETS: 153 10*3/uL (ref 150–400)
RBC: 4.7 MIL/uL (ref 4.22–5.81)
RDW: 13.5 % (ref 11.5–15.5)
WBC: 6.2 10*3/uL (ref 4.0–10.5)

## 2016-05-31 LAB — BASIC METABOLIC PANEL
Anion gap: 6 (ref 5–15)
BUN: 25 mg/dL — ABNORMAL HIGH (ref 6–20)
CALCIUM: 8.8 mg/dL — AB (ref 8.9–10.3)
CO2: 23 mmol/L (ref 22–32)
CREATININE: 1.1 mg/dL (ref 0.61–1.24)
Chloride: 107 mmol/L (ref 101–111)
GFR calc non Af Amer: >60 mL/min (ref >60)
Glucose, Bld: 94 mg/dL (ref 65–99)
Potassium: 3.7 mmol/L (ref 3.5–5.1)
SODIUM: 136 mmol/L (ref 135–145)

## 2016-05-31 LAB — I-STAT TROPONIN, ED: TROPONIN I, POC: 0 ng/mL (ref 0.00–0.08)

## 2016-05-31 MED ORDER — GI COCKTAIL ~~LOC~~
30.0000 mL | Freq: Once | ORAL | Status: AC
  Administered 2016-05-31: 30 mL via ORAL
  Filled 2016-05-31: qty 30

## 2016-05-31 MED ORDER — IOPAMIDOL (ISOVUE-370) INJECTION 76%
INTRAVENOUS | Status: AC
  Administered 2016-05-31: 100 mL
  Filled 2016-05-31: qty 100

## 2016-05-31 NOTE — Telephone Encounter (Signed)
Returned call to patient.He stated he has been having chest pain off and on since yesterday.Stated pain started last night around 7:00 or 8:00 pm.Stated he woke up this morning at 3:00 am with chest pain.Stated he is sitting at his desk now and chest pain returned.Spoke to [facility] he advised go to ER.Trish called.

## 2016-05-31 NOTE — ED Notes (Signed)
Pt denies concerns with dc 

## 2016-05-31 NOTE — ED Provider Notes (Signed)
CSN: [id number]     Arrival date & time 05/31/16  1604 History   By signing my name below, I, Rowan Blase, attest that this documentation has been prepared under the direction and in the presence of Blanchie Dessert, MD . Electronically Signed: Rowan Blase, Scribe. 05/31/2016. 6:35 PM.   Chief Complaint  Patient presents with  . Chest Pain   The history is provided by the patient. No language interpreter was used.   HPI Comments:  Michael Barron is a 65 y.o. male with PMHx of cardiac cath, HTN, thoracic aortic aneurysm, and iliac aneurysm who presents to the Emergency Department complaining of recurrent, intermittent sharp, stabbing left lower chest pain beginning yesterday after eating. Episodes last 2-3 seconds with hours between episodes. He has had similar pain previously with SOB and nausea that is usually alleviated with laying down. Pain is not worse after eating or with exertion. Pt reports associated left sided chest pressure currently. He notes h/o heartburn but states current pain does not feel like heartburn. Pt has yearly checkups to monitor aneurysms and plans to make an upcoming appointment in July. Denies h/o smoking. He drinks ETOH 1-2 times per week. 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 Denies shortness of breath, cough, nausea, diaphoresis, recent procedures, recent travel, pain or swelling in one leg, recent cold, recent antibiotic use, or recent injury or strain.   Past Medical History  Diagnosis Date  . History of cardiac cath 2008    normal cardiac catheterization 04/09/07  . Hypertension   . Thoracic aortic aneurysm ([place])   . Iliac aneurysm [facility])    Past Surgical History  Procedure Laterality Date  . Herniated disk  1996   Family History  Problem Relation Age of Onset  . Cancer Mother     uterus  . Cancer Father     liver  . Cancer Sister     ?   Social History  Substance Use Topics  .  Smoking status: Never Smoker   . Smokeless tobacco: None  . Alcohol Use: None    Review of Systems  Respiratory: Negative for cough and shortness of breath.   Cardiovascular: Positive for chest pain.  All other systems reviewed and are negative.  Allergies  Amoxicillin; Fenofibrate micronized; Lisinopril; and Minocin  Home Medications   Prior to Admission medications   Medication Sig Start Date End Date Taking? Authorizing Provider  aspirin 81 MG tablet Take 81 mg by mouth daily.   Yes Historical Provider, MD  ibuprofen (ADVIL,MOTRIN) 200 MG tablet Take 200 mg by mouth every 6 (six) hours as needed for moderate pain.    Yes Historical Provider, MD  losartan-hydrochlorothiazide (HYZAAR) 50-12.5 MG per tablet Take 1 tablet by mouth 2 (two) times daily.  04/08/15  Yes Historical Provider, MD  naproxen sodium (ANAPROX) 220 MG tablet Take 220 mg by mouth 2 (two) times daily as needed (for pain).   Yes Historical Provider, MD   BP 123/86 mmHg  Pulse 51  Temp(Src) 98.8 F (37.1 C) (Oral)  Resp 13  Ht 5\' 11"  (1.803 m)  Wt 223 lb (101.152 kg)  BMI 31.12 kg/m2  SpO2 97%   Physical Exam  Constitutional: He is oriented to person, place, and time. He appears well-developed and well-nourished.  HENT:  Head: Normocephalic and atraumatic.  Eyes: EOM are normal.  Neck: Normal range of motion.  Cardiovascular: Normal rate, regular rhythm, normal  heart sounds and intact distal pulses.   Pulmonary/Chest: Effort normal and breath sounds normal. No respiratory distress.  Abdominal: Soft. He exhibits no distension. There is no tenderness.  Musculoskeletal: Normal range of motion.  Neurological: He is alert and oriented to person, place, and time.  Skin: Skin is warm and dry.  Psychiatric: He has a normal mood and affect. Judgment normal.  Nursing note and vitals reviewed.   ED Course  Procedures  DIAGNOSTIC STUDIES:  Oxygen Saturation is 96% on RA, normal by my interpretation.     COORDINATION OF CARE:  6:28 PM Reviewed lab and imaging results with pt. Will order CT angio chest/abdomen/pelvis. Discussed treatment plan with pt at bedside and pt agreed to plan.  Labs Review Labs Reviewed  BASIC METABOLIC PANEL - Abnormal; Notable for the following:    BUN 25 (*)    Calcium 8.8 (*)    All other components within normal limits  CBC  I-STAT TROPOININ, ED    Imaging Review Dg Chest 2 View  05/31/2016  CLINICAL DATA:  Left-sided chest pain EXAM: CHEST  2 VIEW COMPARISON:  January 02, 2012 FINDINGS: Elevation of the right hemidiaphragm remains. No pneumothorax. No pulmonary nodules, masses, or focal infiltrates. No acute abnormalities. IMPRESSION: No active cardiopulmonary disease. Electronically Signed   By: Dorise Bullion III M.D   On: 05/31/2016 16:37   Ct Angio Chest/abd/pel For Dissection W And/or W/wo  05/31/2016  CLINICAL DATA:  65 year old male with hypertension and history of thoracic aortic aneurysm presenting with sharp stabbing chest pain. EXAM: CT ANGIOGRAPHY CHEST, ABDOMEN AND PELVIS TECHNIQUE: Multidetector CT imaging through the chest, abdomen and pelvis was performed using the standard protocol during bolus administration of intravenous contrast. Multiplanar reconstructed images and MIPs were obtained and reviewed to evaluate the vascular anatomy. CONTRAST:  100 cc Isovue 370 COMPARISON:  CT dated 06/29/2015 FINDINGS: CTA CHEST FINDINGS Right lung base subsegmental consolidative changes with air bronchogram likely representing atelectasis. Pneumonia is not excluded. Clinical correlation is recommended. The remainder of the lungs are clear. There is no pleural effusion or pneumothorax. The central airways are patent. There is stable mild dilated appearing of the ascending aorta measuring up to 4.1 cm in the midportion of the ascending aorta. The aortic isthmus measures up to 4.2 cm in diameter and similar to prior study. The descending thoracic aorta measures  up to 2.9 cm in diameter. There is no dissection. The origins of the great vessels of the aortic arch appear patent. There is no CT evidence of central pulmonary artery embolus. Top-normal cardiac size. No pericardial effusion. There is no hilar or mediastinal adenopathy. The esophagus is grossly unremarkable. No thyroid nodules identified. There is no axillary adenopathy. The chest wall soft tissues appear unremarkable. There is mild degenerative changes of the spine. No acute osseous pathology. Review of the MIP images confirms the above findings. CTA ABDOMEN AND PELVIS FINDINGS S no intra-abdominal free air or free fluid. Mild hepatic contour nodularity. Clinical correlation is recommended to evaluate for underlying cirrhosis. The gallbladder, pancreas, spleen, adrenal glands appear unremarkable. There probable bilateral renal parapelvic cysts versus less likely mild hydronephrosis. This appearance is similar to prior study. The kidneys are otherwise unremarkable. The visualized ureters and urinary bladder are grossly unremarkable. There is mild enlargement of the prostate gland with median lobe hypertrophy measuring 5 cm in transverse axial dimension. The seminal vesicles are grossly unremarkable. There is thickened appearance of the gastric wall likely related to underdistention. Gastritis is less likely.  Clinical correlation is recommended. There is no evidence of bowel obstruction or active inflammation. There is sigmoid diverticulosis without active inflammatory changes. Normal appendix. The abdominal aorta is mildly tortuous. There is stable aneurysmal dilatation of the left common iliac artery measuring up to 2.8 cm in diameter. The external and internal iliac arteries appear patent bilaterally. There is no dissection. The origins of the celiac axis, SMA, IMA as well as the origins of the renal arteries are patent. 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 There is a classic celiac axis  branching anatomy. No portal venous gas identified. There is no adenopathy. There is a small fat containing umbilical hernia. The the abdominal wall soft tissues are otherwise unremarkable. There is degenerative changes of the spine. No acute fracture. Review of the MIP images confirms the above findings. IMPRESSION: Mildly dilated thoracic aorta measuring up to 4.1 cm in the mid ascending aorta and 4.2 cm in the isthmus. No dissection. There is no dissection or aneurysmal dilatation of the abdominal aorta. Stable focal aneurysmal dilatation of the left common iliac artery measuring up to 2.8 cm in diameter. No CT evidence of central pulmonary artery embolism. Right lung base subsegmental atelectasis versus less likely pneumonia. Underdistention of the stomach versus less likely gastritis. Clinical correlation is recommended. Electronically Signed   By: Anner Crete M.D.   On: 05/31/2016 20:35   I have personally reviewed and evaluated these images and lab results as part of my medical decision-making.   EKG Interpretation   Date/Time:  Wednesday May 31 2016 16:06:32 EDT Ventricular Rate:  68 PR Interval:  158 QRS Duration: 134 QT Interval:  398 QTC Calculation: 423 R Axis:   -67 Text Interpretation:  Sinus rhythm with Premature atrial complexes Left  ventricular hypertrophy with QRS widening No previous tracing Confirmed by  Maryan Rued  MD, Loree Fee ([id number]) on 05/31/2016 5:58:06 PM      MDM   Final diagnoses:  Atypical chest pain  Gastritis    Patient is a 43 rolled male with a history of thoracic and iliac aneurysms, hypertension and normal cardiac catheterization in 2008 presenting with sharp intermittent left-sided chest pain that is atypical for ACS. The pains last 2-3 seconds and does not cause associated symptoms. It initially began after eating but has been intermittent now not related to food. Positioning does not seem to affect it. He has had symptoms like this before that's  always resolved with lying down and he never had them recurrently in the same day. He denies any recent infectious symptoms or medication changes. He is well-appearing on exam with no abdominal tenderness or reproducible chest pain. Heart and lungs are clear and no distal edema or pulse deficits. EKG is unchanged from prior EKG approximately one year ago.  Labs including CBC, BMP and troponin are negative. Repeated a CT of his chest disease up to have an evaluation of the aneurysm next month and to ensure that this is not the cause for his pain. CT showed no change in aneurysmal dilation of the thoracic or iliac artery. Possible gastritis of the stomach. Patient was given a GI cocktail with improvement of his symptoms. At this time feel that patient is reasonably screening can follow-up with his cardiologist when necessary.  I personally performed the services described in this documentation, which was scribed in my presence.  The recorded information has been reviewed and considered.    Blanchie Dessert, MD 05/31/16 2137

## 2016-05-31 NOTE — Telephone Encounter (Signed)
Pt c/o of Chest Pain: STAT if CP now or developed within 24 hours  1. Are you having CP right now? no  2. Are you experiencing any other symptoms (ex. SOB, nausea, vomiting, sweating)? no  3. How long have you been experiencing CP? Yesterday 6/27  4. Is your CP continuous or coming and going? Comes/goes- does not last long   5. Have you taken Nitroglycerin? no ?

## 2016-05-31 NOTE — ED Notes (Signed)
Patient complains of intermittent left sided chest pain that he describes as sharp since 8pm last night. Non-radiating, no associated symptoms

## 2016-06-27 ENCOUNTER — Encounter: Payer: Self-pay | Admitting: Cardiovascular Disease

## 2016-07-26 ENCOUNTER — Ambulatory Visit (INDEPENDENT_AMBULATORY_CARE_PROVIDER_SITE_OTHER): Payer: 59 | Admitting: Cardiovascular Disease

## 2016-07-26 ENCOUNTER — Encounter: Payer: Self-pay | Admitting: Cardiovascular Disease

## 2016-07-26 DIAGNOSIS — I10 Essential (primary) hypertension: Secondary | ICD-10-CM | POA: Diagnosis not present

## 2016-07-26 DIAGNOSIS — I712 Thoracic aortic aneurysm, without rupture, unspecified: Secondary | ICD-10-CM

## 2016-07-26 DIAGNOSIS — E785 Hyperlipidemia, unspecified: Secondary | ICD-10-CM | POA: Diagnosis not present

## 2016-07-26 NOTE — Assessment & Plan Note (Signed)
History of hyperlipidemia recently begun on Crestor by his PCP

## 2016-07-26 NOTE — Assessment & Plan Note (Signed)
History of hypertension blood pressure measures 114/72. He is on losartan hydrochlorothiazide. Continue current meds at current dosing

## 2016-07-26 NOTE — Assessment & Plan Note (Signed)
History of thoracic aortic aneurysm recently measured by CT angiography 05/23/16 measuring 4.2 cm unchanged from prior studies.

## 2016-07-26 NOTE — Progress Notes (Signed)
07/26/2016 Michael Barron   [birth year]  [id number]  Primary Physician Bing Matter, PA-C Primary Cardiologist: Lorretta Harp MD Renae Gloss  HPI:  Michael Barron is a 65 year old mildly overweight married Caucasian male whose wife Michael Barron is also a patient of mine. I last saw him in the office 06/18/15. He has a history of normal coronary arteries by cath in 2008. 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 He had a 2 cm mass arising from the upper pole of his left kidney which was laparoscopically removed by Dr. Alinda Money back in 2008. He also has a history of left common iliac artery aneurysm. The problems include hypertension and hyperlipidemia as well as non-insulin requiring diabetes. He had lost quite a bit overweight and I saw him last as result of diet. He does complain of some dyspnea on exertion although his skin 30 pounds since I saw him last. He had a stress echo performed at [facility] that showed some vague apical and septal stress-induced wall motion analysis the right do not think these were high-risk findings and he is relatively asymptomatic. He was recently seen in the emergency room for chest pain after having an entirely and medial. The thought was that he had gastritis. The fever resolved with a GI cocktail. His workup was essentially negative. EKG showed no acute changes. A CT angiogram of his chest showed a stable 4.2 cm thoracic aortic aneurysm. He has had no recurrent symptoms.   Current Outpatient Prescriptions  Medication Sig Dispense Refill  . aspirin 81 MG tablet Take 81 mg by mouth daily.    Marland Kitchen ibuprofen (ADVIL,MOTRIN) 200 MG tablet Take 200 mg by mouth every 6 (six) hours as needed for moderate pain.     Marland Kitchen losartan-hydrochlorothiazide (HYZAAR) 50-12.5 MG per tablet Take 1 tablet by mouth 2 (two) times daily.     . naproxen sodium (ANAPROX) 220 MG tablet Take 220 mg by mouth 2 (two) times daily as needed (for pain).    .  Rosuvastatin Calcium (CRESTOR PO) Take 1 tablet by mouth daily.     No current facility-administered medications for this visit.     Allergies  Allergen Reactions  . Amoxicillin Rash  . Fenofibrate Micronized Other (See Comments)  . Lisinopril Cough  . Minocin [Minocycline] Rash    Social History   Social History  . Marital status: Married    Spouse name: N/A  . Number of children: N/A  . Years of education: N/A   Occupational History  . Not on file.   Social History Main Topics  . Smoking status: Never Smoker  . Smokeless tobacco: Never Used  . Alcohol use Not on file  . Drug use: Unknown  . Sexual activity: Not on file   Other Topics Concern  . Not on file   Social History Narrative  . No narrative on file     Review of Systems: General: negative for chills, fever, night sweats or weight changes.  Cardiovascular: negative for chest pain, dyspnea on exertion, edema, orthopnea, palpitations, paroxysmal nocturnal dyspnea or shortness of breath Dermatological: negative for rash Respiratory: negative for cough or wheezing Urologic: negative for hematuria Abdominal: negative for nausea, vomiting, diarrhea, bright red blood per rectum, melena, or hematemesis Neurologic: negative for visual changes, syncope, or dizziness All other systems reviewed and are otherwise negative except as noted above.    Blood pressure 114/72, pulse 60, height 5\' 11"  (1.803 m), weight 228 lb (  103.4 kg).  General appearance: alert and no distress Neck: no adenopathy, no carotid bruit, no JVD, supple, symmetrical, trachea midline and thyroid not enlarged, symmetric, no tenderness/mass/nodules Lungs: clear to auscultation bilaterally Heart: regular rate and rhythm, S1, S2 normal, no murmur, click, rub or gallop Extremities: extremities normal, atraumatic, no cyanosis or edema  EKG not performed today  ASSESSMENT AND PLAN:   Essential hypertension History of hypertension blood pressure  measures 114/72. He is on losartan hydrochlorothiazide. Continue current meds at current dosing  Hyperlipidemia History of hyperlipidemia recently begun on Crestor by his PCP  Thoracic aortic aneurysm History of thoracic aortic aneurysm recently measured by CT angiography 05/23/16 measuring 4.2 cm unchanged from prior studies.      Lorretta Harp MD FACP,FACC,FAHA, [facility] 07/26/2016 4:02 PM

## 2016-07-26 NOTE — Patient Instructions (Signed)
Medication Instructions:  Your physician recommends that you continue on your current medications as directed. Please refer to the Current Medication list given to you today.   Labwork: Labwork will be requested from your primary care physician.  Follow-Up: Your physician wants you to follow-up in: 12 MONTHS WITH DR BERRY. You will receive a reminder letter in the mail two months in advance. If you don't receive a letter, please call our office to schedule the follow-up appointment.   If you need a refill on your cardiac medications before your next appointment, please call your pharmacy.   

## 2019-09-05 ENCOUNTER — Other Ambulatory Visit: Payer: Self-pay

## 2019-09-05 ENCOUNTER — Encounter: Payer: Self-pay | Admitting: Cardiovascular Disease

## 2019-09-05 ENCOUNTER — Ambulatory Visit (INDEPENDENT_AMBULATORY_CARE_PROVIDER_SITE_OTHER): Payer: Medicare HMO | Admitting: Cardiovascular Disease

## 2019-09-05 DIAGNOSIS — I723 Aneurysm of iliac artery: Secondary | ICD-10-CM

## 2019-09-05 DIAGNOSIS — E782 Mixed hyperlipidemia: Secondary | ICD-10-CM | POA: Diagnosis not present

## 2019-09-05 DIAGNOSIS — I712 Thoracic aortic aneurysm, without rupture, unspecified: Secondary | ICD-10-CM

## 2019-09-05 DIAGNOSIS — I10 Essential (primary) hypertension: Secondary | ICD-10-CM | POA: Diagnosis not present

## 2019-09-05 NOTE — Progress Notes (Addendum)
09/05/2019 Michael Barron   Aug 31, 1951  [id number]  Primary Physician Aletha Barron., PA-C Primary Cardiologist: Lorretta Harp MD Michael Barron, [place]  HPI:  Michael Barron is a 68 y.o.  mildly overweight married Caucasian male whose wife Michael Barron)  is also a patient of mine. I last saw him in the office 06/18/15. He has a history of normal coronary arteries by cath in 2008. 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 He had a 2 cm mass arising from the upper pole of his left kidney which was laparoscopically removed by Dr. Alinda Money back in 2008. He also has a history of left common iliac artery aneurysm. The problems include hypertension and hyperlipidemia as well as non-insulin requiring diabetes. He had lost quite a bit overweight and I saw him last as result of diet. He does complain of some dyspnea on exertion although his skin 30 pounds since I saw him last. He had a stress echo performed at [facility] that showed some vague apical and septal stress-induced wall motion analysis the right do not think these were high-risk findings and he is relatively asymptomatic. He was recently seen in the emergency room for chest pain after having an entirely and medial. The thought was that he had gastritis. The fever resolved with a GI cocktail. His workup was essentially negative. EKG showed no acute changes. A CT angiogram of his chest showed a stable 4.2 cm thoracic aortic aneurysm. He has had no recurrent symptoms.  Since I saw him 3 years ago he is remained stable.  He is very active.  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  He denies chest pain or shortness of breath.   Current Meds  Medication Sig  . aspirin 81 MG tablet Take 81 mg by mouth daily.  Marland Kitchen atorvastatin (LIPITOR) 10 MG tablet TAKE 1 TABLET EVERY DAY  . ibuprofen (ADVIL,MOTRIN) 200 MG tablet Take 200 mg by mouth every 6 (six) hours as needed  for moderate pain.   Marland Kitchen losartan-hydrochlorothiazide (HYZAAR) 50-12.5 MG per tablet Take 1 tablet by mouth 2 (two) times daily.   . naproxen sodium (ANAPROX) 220 MG tablet Take 220 mg by mouth 2 (two) times daily as needed (for pain).  . [DISCONTINUED] Rosuvastatin Calcium (CRESTOR PO) Take 1 tablet by mouth daily.     Allergies  Allergen Reactions  . Amoxicillin Rash  . Fenofibrate Micronized Other (See Comments)  . Lisinopril Cough  . Minocin [Minocycline] Rash    Social History   Socioeconomic History  . Marital status: Married    Spouse name: Not on file  . Number of children: Not on file  . Years of education: Not on file  . Highest education level: Not on file  Occupational History  . Not on file  Social Needs  . Financial resource strain: Not on file  . Food insecurity    Worry: Not on file    Inability: Not on file  . Transportation needs    Medical: Not on file    Non-medical: Not on file  Tobacco Use  . Smoking status: Never Smoker  . Smokeless tobacco: Never Used  Substance and Sexual Activity  . Alcohol use: Not on file  . Drug use: Not on file  . Sexual activity: Not on file  Lifestyle  . Physical activity    Days per week: Not on file  Minutes per session: Not on file  . Stress: Not on file  Relationships  . Social Herbalist on phone: Not on file    Gets together: Not on file    Attends religious service: Not on file    Active member of club or organization: Not on file    Attends meetings of clubs or organizations: Not on file    Relationship status: Not on file  . Intimate partner violence    Fear of current or ex partner: Not on file    Emotionally abused: Not on file    Physically abused: Not on file    Forced sexual activity: Not on file  Other Topics Concern  . Not on file  Social History Narrative  . Not on file     Review of Systems: General: negative for chills, fever, night sweats or weight changes.  Cardiovascular:  negative for chest pain, dyspnea on exertion, edema, orthopnea, palpitations, paroxysmal nocturnal dyspnea or shortness of breath Dermatological: negative for rash Respiratory: negative for cough or wheezing Urologic: negative for hematuria Abdominal: negative for nausea, vomiting, diarrhea, bright red blood per rectum, melena, or hematemesis Neurologic: negative for visual changes, syncope, or dizziness All other systems reviewed and are otherwise negative except as noted above.    Blood pressure (!) 131/92, pulse (!) 53, temperature 97.9 F (36.6 C), height 5\' 11"  (1.803 m), weight 239 lb 6.4 oz (108.6 kg), SpO2 95 %.  General appearance: alert and no distress Neck: no adenopathy, no carotid bruit, no JVD, supple, symmetrical, trachea midline and thyroid not enlarged, symmetric, no tenderness/mass/nodules Lungs: clear to auscultation bilaterally Heart: regular rate and rhythm, S1, S2 normal, no murmur, click, rub or gallop Extremities: extremities normal, atraumatic, no cyanosis or edema Pulses: 2+ and symmetric Skin: Skin color, texture, turgor normal. No rashes or lesions Neurologic: Alert and oriented X 3, normal strength and tone. Normal symmetric reflexes. Normal coordination and gait  EKG sinus bradycardia 54 with a nonspecific IVCD and left anterior fascicular block.  I personally reviewed this EKG.  ASSESSMENT AND PLAN:   Essential hypertension History of essential hypertension with blood pressure measured today at 131/92.  He is on losartan hydrochlorothiazide  Thoracic aortic aneurysm History of thoracic aortic aneurysm measuring 4.5 cm by CTA in the past.  His last CTA performed 05/23/2016 showed that his aneurysm only measured 4.2 cm.  We will repeat CTA to follow-up  Aneurysm of left iliac artery History left iliac aneurysm in the past found in 2008.  We will remeasure by CTA  Hyperlipidemia History of hyperlipidemia on statin therapy followed by his PCP.  Recent  blood work performed by his PCP 08/25/2019 revealed a total cholesterol of 161, LDL of 88 and HDL of 59.      Lorretta Harp MD FACP,FACC,FAHA, [facility] 09/05/2019 3:56 PM

## 2019-09-05 NOTE — Assessment & Plan Note (Signed)
History of thoracic aortic aneurysm measuring 4.5 cm by CTA in the past.  His last CTA performed 05/23/2016 showed that his aneurysm only measured 4.2 cm.  We will repeat CTA to follow-up

## 2019-09-05 NOTE — Patient Instructions (Signed)
Medication Instructions:  Your physician recommends that you continue on your current medications as directed. Please refer to the Current Medication list given to you today.  If you need a refill on your cardiac medications before your next appointment, please call your pharmacy.   Lab work: Your physician recommends that you return for lab work 3-7 days prior to your CT SCANNING (COMPLETE BLOOD COUNT; BASIC METABOLIC PANEL)  [place] YOUR LAB WORK TO [place] AT [phone]  If you have labs (blood work) drawn today and your tests are completely normal, you will receive your results only by: Marland Kitchen MyChart Message (if you have MyChart) OR . A paper copy in the mail If you have any lab test that is abnormal or we need to change your treatment, we will call you to review the results.  Testing/Procedures: Your physician has ordered CT scanning for your chest and pelvis to assess your thoracic aorta and iliac arteries. Non-Cardiac CT scanning, (CAT scanning), is a noninvasive, special x-ray that produces cross-sectional images of the body using x-rays and a computer. CT scans help physicians diagnose and treat medical conditions. For some CT exams, a contrast material is used to enhance visibility in the area of the body being studied. CT scans provide greater clarity and reveal more details than regular x-ray exams. TO BE SCHEDULED  Follow-Up: At [facility], you and your health needs are our priority.  As part of our continuing mission to provide you with exceptional heart care, we have created designated Provider Care Teams.  These Care Teams include your primary Cardiologist (physician) and Advanced Practice Providers (APPs -  Physician Assistants and Nurse Practitioners) who all work together to provide you with the care you need, when you need it. You will need a follow up appointment in 12 months with Dr. Quay Burow.  Please call our office  2 months in advance to schedule this/each appointment.

## 2019-09-05 NOTE — Assessment & Plan Note (Addendum)
History of hyperlipidemia on statin therapy followed by his PCP.  Recent blood work performed by his PCP 08/25/2019 revealed a total cholesterol of 161, LDL of 88 and HDL of 59.

## 2019-09-05 NOTE — Assessment & Plan Note (Signed)
History of essential hypertension with blood pressure measured today at 131/92.  He is on losartan hydrochlorothiazide

## 2019-09-05 NOTE — Assessment & Plan Note (Signed)
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  We will remeasure by CTA

## 2019-10-02 ENCOUNTER — Ambulatory Visit (INDEPENDENT_AMBULATORY_CARE_PROVIDER_SITE_OTHER)
Admission: RE | Admit: 2019-10-02 | Discharge: 2019-10-02 | Disposition: A | Discharge status: Home / Self Care | Payer: Medicare HMO | Source: Ambulatory Visit | Attending: Cardiovascular Disease | Admitting: Cardiovascular Disease

## 2019-10-02 ENCOUNTER — Other Ambulatory Visit: Payer: Self-pay

## 2019-10-02 DIAGNOSIS — I723 Aneurysm of iliac artery: Secondary | ICD-10-CM

## 2019-10-02 DIAGNOSIS — I712 Thoracic aortic aneurysm, without rupture, unspecified: Secondary | ICD-10-CM

## 2019-10-02 MED ORDER — IOHEXOL 350 MG/ML SOLN
100.0000 mL | Freq: Once | INTRAVENOUS | Status: AC | PRN
  Administered 2019-10-02: 100 mL via INTRAVENOUS

## 2019-10-06 ENCOUNTER — Other Ambulatory Visit: Payer: Self-pay | Admitting: *Deleted

## 2019-10-06 DIAGNOSIS — I712 Thoracic aortic aneurysm, without rupture, unspecified: Secondary | ICD-10-CM

## 2020-09-07 ENCOUNTER — Other Ambulatory Visit: Payer: Self-pay

## 2020-09-07 ENCOUNTER — Other Ambulatory Visit: Payer: Self-pay | Admitting: *Deleted

## 2020-09-07 ENCOUNTER — Ambulatory Visit (INDEPENDENT_AMBULATORY_CARE_PROVIDER_SITE_OTHER): Payer: Medicare HMO | Admitting: Cardiovascular Disease

## 2020-09-07 ENCOUNTER — Encounter: Payer: Self-pay | Admitting: Cardiovascular Disease

## 2020-09-07 DIAGNOSIS — I712 Thoracic aortic aneurysm, without rupture, unspecified: Secondary | ICD-10-CM

## 2020-09-07 DIAGNOSIS — I10 Essential (primary) hypertension: Secondary | ICD-10-CM

## 2020-09-07 DIAGNOSIS — I723 Aneurysm of iliac artery: Secondary | ICD-10-CM | POA: Diagnosis not present

## 2020-09-07 DIAGNOSIS — E782 Mixed hyperlipidemia: Secondary | ICD-10-CM

## 2020-09-07 NOTE — Progress Notes (Signed)
09/07/2020 [name]   July 25, 1951  [id number]  Primary Physician Aletha Halim., PA-C Primary Cardiologist: Lorretta Harp MD [name], [place]  HPI:  Michael Barron is a 69 y.o.   mildly overweight married Caucasian male whose wife Michael Barron Michael Barron)  is also a patient of mine. I last saw him in the 09/05/2019. He has a history of normal coronary arteries by cath in 2008. 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 He had a 2 cm mass arising from the upper pole of his left kidney which was laparoscopically removed by Dr. Alinda Money back in 2008. He also has a history of left common iliac artery aneurysm. The problems include hypertension and hyperlipidemia as well as non-insulin requiring diabetes. He had lost quite a bit overweight and I saw him last as result of diet. He had a stress echo performed at [facility] that showed some vague apical and septal stress-induced wall motion analysis the right do not think these were high-risk findings and he is relatively asymptomatic.He was seen in the emergency room for chest pain after having a large meal.  The thought was that he had gastritis. The chest pain resolved with a GI cocktail. His workup was essentially negative. EKG showed no acute changes. A CT angiogram of his chest showed a stable 4.2 cm thoracic aortic aneurysm. He has had no recurrent symptoms.  Since I saw him 1 year ago he continues to do well.  His weight is stable.  He denies chest pain or shortness of breath.  Recent CTA performed 10/02/2019 revealed a ascending thoracic aortic aneurysm measuring 44 mm (up from 42 mm several years before) and a left iliac aneurysm measuring 20 mm.   Current Meds  Medication Sig  . Cholecalciferol (VITAMIN D) 125 MCG (5000 UT) CAPS      Allergies  Allergen Reactions  . Amoxicillin Rash  . Fenofibrate Micronized Other (See Comments)  . Lisinopril Cough  . Minocin [Minocycline] Rash     Social History   Socioeconomic History  . Marital status: Married    Spouse name: Not on file  . Number of children: Not on file  . Years of education: Not on file  . Highest education level: Not on file  Occupational History  . Not on file  Tobacco Use  . Smoking status: Never Smoker  . Smokeless tobacco: Never Used  Substance and Sexual Activity  . Alcohol use: Not on file  . Drug use: Not on file  . Sexual activity: Not on file  Other Topics Concern  . Not on file  Social History Narrative  . Not on file   Social Determinants of Health   Financial Resource Strain:   . Difficulty of Paying Living Expenses: Not on file  Food Insecurity:   . Worried About Charity fundraiser in the Last Year: Not on file  . Ran Out of Food in the Last Year: Not on file  Transportation Needs:   . Lack of Transportation (Medical): Not on file  . Lack of Transportation (Non-Medical): Not on file  Physical Activity:   . Days of Exercise per Week: Not on file  . Minutes of Exercise per Session: Not on file  Stress:   . Feeling of Stress : Not on file  Social Connections:   . Frequency of Communication with Friends and Family: Not on file  . Frequency of Social Gatherings with Friends and  Family: Not on file  . Attends Religious Services: Not on file  . Active Member of Clubs or Organizations: Not on file  . Attends Archivist Meetings: Not on file  . Marital Status: Not on file  Intimate Partner Violence:   . Fear of Current or Ex-Partner: Not on file  . Emotionally Abused: Not on file  . Physically Abused: Not on file  . Sexually Abused: Not on file     Review of Systems: General: negative for chills, fever, night sweats or weight changes.  Cardiovascular: negative for chest pain, dyspnea on exertion, edema, orthopnea, palpitations, paroxysmal nocturnal dyspnea or shortness of breath Dermatological: negative for rash Respiratory: negative for cough or  wheezing Urologic: negative for hematuria Abdominal: negative for nausea, vomiting, diarrhea, bright red blood per rectum, melena, or hematemesis Neurologic: negative for visual changes, syncope, or dizziness All other systems reviewed and are otherwise negative except as noted above.    Blood pressure 132/74, pulse 65.  General appearance: alert and no distress Neck: no adenopathy, no carotid bruit, no JVD, supple, symmetrical, trachea midline and thyroid not enlarged, symmetric, no tenderness/mass/nodules Lungs: clear to auscultation bilaterally Heart: regular rate and rhythm, S1, S2 normal, no murmur, click, rub or gallop Extremities: extremities normal, atraumatic, no cyanosis or edema Pulses: 2+ and symmetric Skin: Skin color, texture, turgor normal. No rashes or lesions Neurologic: Alert and oriented X 3, normal strength and tone. Normal symmetric reflexes. Normal coordination and gait  EKG sinus rhythm at 65 with a nonspecific IVCD and left axis deviation.  I personally reviewed this EKG.  ASSESSMENT AND PLAN:   Essential hypertension History of essential hypertension blood pressure measured today 132/74.  He is on losartan and hydrochlorothiazide.  Thoracic aortic aneurysm History of a small thoracic aortic aneurysm measuring 44 mm when last checked by CTA 10/02/2019.  We will check it again in 12 months.  Aneurysm of left iliac artery History of left iliac aneurysm measuring 28 mm by CTA performed 10/02/2019.  We will recheck this in 12 months.  Hyperlipidemia History of hyperlipidemia on low-dose atorvastatin followed by his PCP.      Lorretta Harp MD FACP,FACC,FAHA, [facility] 09/07/2020 8:44 AM

## 2020-09-07 NOTE — Assessment & Plan Note (Signed)
History of left iliac aneurysm measuring 28 mm by CTA performed 10/02/2019.  We will recheck this in 12 months.

## 2020-09-07 NOTE — Assessment & Plan Note (Signed)
History of essential hypertension blood pressure measured today 132/74.  He is on losartan and hydrochlorothiazide.

## 2020-09-07 NOTE — Assessment & Plan Note (Signed)
History of hyperlipidemia on low-dose atorvastatin followed by his PCP.

## 2020-09-07 NOTE — Progress Notes (Signed)
CTA

## 2020-09-07 NOTE — Assessment & Plan Note (Signed)
History of a small thoracic aortic aneurysm measuring 44 mm when last checked by CTA 10/02/2019.  We will check it again in 12 months.

## 2020-09-07 NOTE — Patient Instructions (Signed)
  Follow-Up: At [facility], you and your health needs are our priority.  As part of our continuing mission to provide you with exceptional heart care, we have created designated Provider Care Teams.  These Care Teams include your primary Cardiologist (physician) and Advanced Practice Providers (APPs -  Physician Assistants and Nurse Practitioners) who all work together to provide you with the care you need, when you need it.  We recommend signing up for the patient portal called "MyChart".  Sign up information is provided on this After Visit Summary.  MyChart is used to connect with patients for Virtual Visits (Telemedicine).  Patients are able to view lab/test results, encounter notes, upcoming appointments, etc.  Non-urgent messages can be sent to your provider as well.   To learn more about what you can do with MyChart, go to [email or web address].    Your next appointment:   12 month(s)  The format for your next appointment:   In Person  Provider:   You may see Quay Burow MD or one of the following Advanced Practice Providers on your designated Care Team:    Kerin Ransom, PA-C  [place], [place]  Coletta Memos, [place]

## 2020-09-21 ENCOUNTER — Other Ambulatory Visit: Payer: Self-pay

## 2020-09-21 ENCOUNTER — Ambulatory Visit
Admission: RE | Admit: 2020-09-21 | Discharge: 2020-09-21 | Disposition: A | Discharge status: Home / Self Care | Payer: Medicare HMO | Source: Ambulatory Visit | Attending: Cardiovascular Disease | Admitting: Cardiovascular Disease

## 2020-09-21 DIAGNOSIS — I723 Aneurysm of iliac artery: Secondary | ICD-10-CM

## 2020-09-21 DIAGNOSIS — I712 Thoracic aortic aneurysm, without rupture, unspecified: Secondary | ICD-10-CM

## 2020-09-21 MED ORDER — IOPAMIDOL (ISOVUE-370) INJECTION 76%
75.0000 mL | Freq: Once | INTRAVENOUS | Status: AC | PRN
  Administered 2020-09-21: 75 mL via INTRAVENOUS

## 2020-09-23 ENCOUNTER — Other Ambulatory Visit: Payer: Self-pay

## 2020-09-23 DIAGNOSIS — I712 Thoracic aortic aneurysm, without rupture, unspecified: Secondary | ICD-10-CM

## 2020-09-23 DIAGNOSIS — I723 Aneurysm of iliac artery: Secondary | ICD-10-CM

## 2021-09-16 ENCOUNTER — Telehealth: Payer: Self-pay | Admitting: Cardiovascular Disease

## 2021-09-16 NOTE — Telephone Encounter (Signed)
Authorization needs to be completed from [facility] so she can be seen on Tuesday.

## 2021-09-20 ENCOUNTER — Other Ambulatory Visit: Payer: Self-pay

## 2021-09-20 ENCOUNTER — Ambulatory Visit
Admission: RE | Admit: 2021-09-20 | Discharge: 2021-09-20 | Disposition: A | Discharge status: Home / Self Care | Payer: Medicare HMO | Source: Ambulatory Visit | Attending: Cardiovascular Disease | Admitting: Cardiovascular Disease

## 2021-09-20 DIAGNOSIS — I723 Aneurysm of iliac artery: Secondary | ICD-10-CM

## 2021-09-20 DIAGNOSIS — I712 Thoracic aortic aneurysm, without rupture, unspecified: Secondary | ICD-10-CM

## 2021-09-20 MED ORDER — IOPAMIDOL (ISOVUE-370) INJECTION 76%
75.0000 mL | Freq: Once | INTRAVENOUS | Status: AC | PRN
  Administered 2021-09-20: 75 mL via INTRAVENOUS

## 2022-01-17 ENCOUNTER — Ambulatory Visit: Payer: Medicare HMO | Admitting: Cardiovascular Disease

## 2022-01-17 ENCOUNTER — Other Ambulatory Visit: Payer: Self-pay

## 2022-01-17 ENCOUNTER — Encounter: Payer: Self-pay | Admitting: Cardiovascular Disease

## 2022-01-17 DIAGNOSIS — I10 Essential (primary) hypertension: Secondary | ICD-10-CM

## 2022-01-17 DIAGNOSIS — I7121 Aneurysm of the ascending aorta, without rupture: Secondary | ICD-10-CM

## 2022-01-17 DIAGNOSIS — E782 Mixed hyperlipidemia: Secondary | ICD-10-CM | POA: Diagnosis not present

## 2022-01-17 DIAGNOSIS — I723 Aneurysm of iliac artery: Secondary | ICD-10-CM | POA: Diagnosis not present

## 2022-01-17 NOTE — Assessment & Plan Note (Signed)
History of essential hypertension a blood pressure measured today at 130/80.  He is on losartan/hydrochlorothiazide.

## 2022-01-17 NOTE — Assessment & Plan Note (Signed)
History of hyperlipidemia on statin therapy followed by his PCP 

## 2022-01-17 NOTE — Patient Instructions (Signed)
Medication Instructions:  Your physician recommends that you continue on your current medications as directed. Please refer to the Current Medication list given to you today.  *If you need a refill on your cardiac medications before your next appointment, please call your pharmacy*   Testing/Procedures: Non-Cardiac CT Angiography (CTA), is a special type of CT scan that uses a computer to produce multi-dimensional views of major blood vessels throughout the body. In CT angiography, a contrast material is injected through an IV to help visualize the blood vessels  -Aorta/abdominal/pelvis CTA- To be done in Feb. 2024     Follow-Up: At [facility], you and your health needs are our priority.  As part of our continuing mission to provide you with exceptional heart care, we have created designated Provider Care Teams.  These Care Teams include your primary Cardiologist (physician) and Advanced Practice Providers (APPs -  Physician Assistants and Nurse Practitioners) who all work together to provide you with the care you need, when you need it.  We recommend signing up for the patient portal called "MyChart".  Sign up information is provided on this After Visit Summary.  MyChart is used to connect with patients for Virtual Visits (Telemedicine).  Patients are able to view lab/test results, encounter notes, upcoming appointments, etc.  Non-urgent messages can be sent to your provider as well.   To learn more about what you can do with MyChart, go to [email or web address].    Your next appointment:   12 month(s)  The format for your next appointment:   In Person  Provider:   Quay Burow, MD

## 2022-01-17 NOTE — Assessment & Plan Note (Signed)
History of moderate size left common iliac artery aneurysm measuring 27 mm by CTA 09/20/2021.  This was unchanged compared to prior studies.  We will recheck this in 1 year.

## 2022-01-17 NOTE — Progress Notes (Signed)
01/17/2022 Michael Barron   January 12, 1951  [id number]  Primary Physician Aletha Halim., PA-C Primary Cardiologist: Lorretta Harp MD Michael Barron, [place]  HPI:  Michael Barron is a 71 y.o.  mildly overweight married Caucasian male whose wife Michael Barron)  is also a patient of mine. I last saw him in the 09/07/2020. He has a history of normal coronary arteries by cath in 2008. 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 He had a 2 cm mass arising from the upper pole of his left kidney which was laparoscopically removed by Dr. Alinda Barron back in 2008. He also has a history of left common iliac artery aneurysm. The problems include hypertension and hyperlipidemia as well as non-insulin requiring diabetes. He had lost quite a bit overweight and I saw him last as result of diet. He had a stress echo performed at [facility] that showed some vague apical and septal stress-induced wall motion analysis the right do not think these were high-risk findings and he is relatively asymptomatic. He was seen in the emergency room for chest pain after having a large meal.  The thought was that he had gastritis. The chest pain resolved with a GI cocktail. His workup was essentially negative. EKG showed no acute changes. A CT angiogram of his chest showed a stable 4.2 cm thoracic aortic aneurysm. He has had no recurrent symptoms.   Since I saw him 1 a year and a half ago he continues to do well.  He denies chest pain or shortness of breath.  He did have a chest CTA performed 10/21 revealing his ascending thoracic aneurysm measured 45 mm which was stable.  Abdominal pelvic CTA performed 10/22 revealed his left common iliac artery aneurysm was stable at 27 mm.  Current Meds  Medication Sig   allopurinol (ZYLOPRIM) 100 MG tablet Take 100 mg by mouth daily.   aspirin 81 MG tablet Take 81 mg by mouth daily.   atorvastatin (LIPITOR) 10 MG tablet TAKE 1 TABLET EVERY DAY    Cholecalciferol (VITAMIN D) 125 MCG (5000 UT) CAPS    losartan-hydrochlorothiazide (HYZAAR) 50-12.5 MG per tablet Take 1 tablet by mouth 2 (two) times daily.      Allergies  Allergen Reactions   Amoxicillin Rash   Fenofibrate Micronized Other (See Comments)   Lisinopril Cough   Minocin [Minocycline] Rash    Social History   Socioeconomic History   Marital status: Married    Spouse name: Not on file   Number of children: Not on file   Years of education: Not on file   Highest education level: Not on file  Occupational History   Not on file  Tobacco Use   Smoking status: Never   Smokeless tobacco: Never  Substance and Sexual Activity   Alcohol use: Not on file   Drug use: Not on file   Sexual activity: Not on file  Other Topics Concern   Not on file  Social History Narrative   Not on file   Social Determinants of Health   Financial Resource Strain: Not on file  Food Insecurity: Not on file  Transportation Needs: Not on file  Physical Activity: Not on file  Stress: Not on file  Social Connections: Not on file  Intimate Partner Violence: Not on file     Review of Systems: General: negative for chills, fever, night sweats or weight changes.  Cardiovascular: negative for chest pain, dyspnea on exertion,  edema, orthopnea, palpitations, paroxysmal nocturnal dyspnea or shortness of breath Dermatological: negative for rash Respiratory: negative for cough or wheezing Urologic: negative for hematuria Abdominal: negative for nausea, vomiting, diarrhea, bright red blood per rectum, melena, or hematemesis Neurologic: negative for visual changes, syncope, or dizziness All other systems reviewed and are otherwise negative except as noted above.    Blood pressure 130/80, pulse 71, height 5\' 11"  (1.803 m), weight 242 lb (109.8 kg), SpO2 96 %.  General appearance: alert and no distress Neck: no adenopathy, no carotid bruit, no JVD, supple, symmetrical, trachea midline, and  thyroid not enlarged, symmetric, no tenderness/mass/nodules Lungs: clear to auscultation bilaterally Heart: regular rate and rhythm, S1, S2 normal, no murmur, click, rub or gallop Extremities: extremities normal, atraumatic, no cyanosis or edema Pulses: 2+ and symmetric Skin: Skin color, texture, turgor normal. No rashes or lesions Neurologic: Grossly normal  EKG normal sinus rhythm at 71 with a nonspecific IVCD and left axis deviation.  I personally reviewed this EKG.  ASSESSMENT AND PLAN:   Essential hypertension History of essential hypertension a blood pressure measured today at 130/80.  He is on losartan/hydrochlorothiazide.  Thoracic aortic aneurysm ([place]) History of a moderate size ascending thoracic aortic aneurysm measuring 45 mm 09/22/2020.  This will be rechecked next year.  Aneurysm of left iliac artery ([facility]) History of moderate size left common iliac artery aneurysm measuring 27 mm by CTA 09/20/2021.  This was unchanged compared to prior studies.  We will recheck this in 1 year.  Hyperlipidemia History of hyperlipidemia on statin therapy followed by his PCP.     Lorretta Harp MD FACP,FACC,FAHA, [facility] 01/17/2022 2:52 PM

## 2022-01-17 NOTE — Assessment & Plan Note (Signed)
History of a moderate size ascending thoracic aortic aneurysm measuring 45 mm 09/22/2020.  This will be rechecked next year.

## 2022-01-18 ENCOUNTER — Encounter: Payer: Self-pay | Admitting: Cardiovascular Disease

## 2022-10-16 ENCOUNTER — Ambulatory Visit (INDEPENDENT_AMBULATORY_CARE_PROVIDER_SITE_OTHER): Payer: Medicare HMO

## 2022-10-16 ENCOUNTER — Ambulatory Visit (INDEPENDENT_AMBULATORY_CARE_PROVIDER_SITE_OTHER): Payer: Medicare HMO | Admitting: Orthopaedic Surgery

## 2022-10-16 DIAGNOSIS — M25552 Pain in left hip: Secondary | ICD-10-CM | POA: Diagnosis not present

## 2022-10-16 DIAGNOSIS — M7062 Trochanteric bursitis, left hip: Secondary | ICD-10-CM

## 2022-10-16 NOTE — Progress Notes (Signed)
The patient is a very active and pleasant 71 year old gentleman I am seeing for the first time as a new patient.  He reports 2 to 3 years of left hip pain he points to the lateral aspect of his hip as a source of his pain with no known injury.  He denies any groin pain.  He says is become uncomfortable with walking and laying on that side.  If he does drive long distances like to [place] he has a lot of pain on the lateral aspect of that hip.  He has had no treatment for this.  He is not a diabetic.  He did fall recently injuring the left side of his ribs.  He denies any shortness of breath.  He denies any fever, chills, nausea, vomiting.  I did review all of his medications and other medical issues within epic.  Examination of his left hip is basically normal in terms of fluid and full range of motion of the hip with no blocks to rotation and no pain in the groin at all.  When I have him lay on the side he does have some pain over the tip of the trochanter and the proximal IT band.  His knee exam is normal and his back exam is normal.  An AP pelvis and lateral left hip shows normal-appearing hips bilaterally with well-maintained joint space.  There is no cortical irregularity around either trochanteric area.  The signs and symptoms are consistent with trochanteric bursitis.  I did show him stretching exercises to try.  He says Advil is helping so he can continue that and I recommended Voltaren gel.  I did recommend a steroid injection over the trochanteric area but he is deferring this for now.  However if this is not getting better he knows to call us to come in for an injection over his left hip trochanteric area.  All questions and concerns were answered and addressed.

## 2022-12-18 ENCOUNTER — Ambulatory Visit (HOSPITAL_COMMUNITY): Payer: Medicare HMO

## 2022-12-29 ENCOUNTER — Ambulatory Visit (HOSPITAL_COMMUNITY): Payer: Medicare HMO

## 2022-12-29 ENCOUNTER — Ambulatory Visit (HOSPITAL_COMMUNITY)
Admission: RE | Admit: 2022-12-29 | Discharge: 2022-12-29 | Disposition: A | Discharge status: Home / Self Care | Payer: Medicare HMO | Source: Ambulatory Visit | Attending: Cardiovascular Disease | Admitting: Cardiovascular Disease

## 2022-12-29 DIAGNOSIS — I10 Essential (primary) hypertension: Secondary | ICD-10-CM | POA: Insufficient documentation

## 2022-12-29 DIAGNOSIS — I723 Aneurysm of iliac artery: Secondary | ICD-10-CM | POA: Insufficient documentation

## 2022-12-29 DIAGNOSIS — I7121 Aneurysm of the ascending aorta, without rupture: Secondary | ICD-10-CM

## 2022-12-29 DIAGNOSIS — E782 Mixed hyperlipidemia: Secondary | ICD-10-CM | POA: Diagnosis present

## 2022-12-29 MED ORDER — IOHEXOL 350 MG/ML SOLN
100.0000 mL | Freq: Once | INTRAVENOUS | Status: AC | PRN
  Administered 2022-12-29: 100 mL via INTRAVENOUS

## 2022-12-29 MED ORDER — SODIUM CHLORIDE (PF) 0.9 % IJ SOLN
INTRAMUSCULAR | Status: AC
  Filled 2022-12-29: qty 50

## 2023-03-20 ENCOUNTER — Ambulatory Visit: Payer: Medicare HMO | Admitting: Cardiovascular Disease

## 2023-03-30 ENCOUNTER — Ambulatory Visit: Payer: Medicare HMO | Attending: Cardiovascular Disease | Admitting: Cardiovascular Disease

## 2023-03-30 ENCOUNTER — Encounter: Payer: Self-pay | Admitting: Cardiovascular Disease

## 2023-03-30 VITALS — BP 118/78 | HR 71 | Ht 71.0 in | Wt 242.2 lb

## 2023-03-30 DIAGNOSIS — E782 Mixed hyperlipidemia: Secondary | ICD-10-CM | POA: Diagnosis not present

## 2023-03-30 DIAGNOSIS — I7121 Aneurysm of the ascending aorta, without rupture: Secondary | ICD-10-CM | POA: Diagnosis not present

## 2023-03-30 DIAGNOSIS — I10 Essential (primary) hypertension: Secondary | ICD-10-CM | POA: Diagnosis not present

## 2023-03-30 DIAGNOSIS — I723 Aneurysm of iliac artery: Secondary | ICD-10-CM | POA: Diagnosis not present

## 2023-03-30 NOTE — Patient Instructions (Signed)
Medication Instructions:  Your physician recommends that you continue on your current medications as directed. Please refer to the Current Medication list given to you today.  *If you need a refill on your cardiac medications before your next appointment, please call your pharmacy*   Testing/Procedures: Non-Cardiac CT Angiography (CTA) chest & abdomen , is a special type of CT scan that uses a computer to produce multi-dimensional views of major blood vessels throughout the body. In CT angiography, a contrast material is injected through an IV to help visualize the blood vessels. To be done in January 2025.    Follow-Up: At [facility], you and your health needs are our priority.  As part of our continuing mission to provide you with exceptional heart care, we have created designated Provider Care Teams.  These Care Teams include your primary Cardiologist (physician) and Advanced Practice Providers (APPs -  Physician Assistants and Nurse Practitioners) who all work together to provide you with the care you need, when you need it.  We recommend signing up for the patient portal called "MyChart".  Sign up information is provided on this After Visit Summary.  MyChart is used to connect with patients for Virtual Visits (Telemedicine).  Patients are able to view lab/test results, encounter notes, upcoming appointments, etc.  Non-urgent messages can be sent to your provider as well.   To learn more about what you can do with MyChart, go to [email or web address].    Your next appointment:   12 month(s)  Provider:   Nanetta Batty, MD

## 2023-03-30 NOTE — Assessment & Plan Note (Signed)
History of left iliac aneurysm measuring 2.9 mm by CTA performed 12/30/2022.  This will be repeated on an annual basis.

## 2023-03-30 NOTE — Progress Notes (Signed)
03/30/2023 Michael Barron   Aug 01, 1951  [id number]  Primary Physician Richmond Campbell., PA-C Primary Cardiologist: Runell Gess MD Nicholes Calamity, [place]  HPI:  Michael Barron is a 72 y.o.  mildly overweight married Caucasian male whose wife Michael Barron)  is also a patient of mine. I last saw him in the 01/17/2022.  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 .  He has a history of normal coronary arteries by cath in 2008. 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 He had a 2 cm mass arising from the upper pole of his left kidney which was laparoscopically removed by Dr. Laverle Patter back in 2008. He also has a history of left common iliac artery aneurysm. The problems include hypertension and hyperlipidemia as well as non-insulin requiring diabetes. He had lost quite a bit overweight and I saw him last as result of diet. He had a stress echo performed at [facility] that showed some vague apical and septal stress-induced wall motion analysis the right do not think these were high-risk findings and he is relatively asymptomatic. He was seen in the emergency room for chest pain after having a large meal.  The thought was that he had gastritis. The chest pain resolved with a GI cocktail. His workup was essentially negative. EKG showed no acute changes. A CT angiogram of his chest showed a stable 4.2 cm thoracic aortic aneurysm. He has had no recurrent symptoms.   Since I saw him 1 a year and a half ago he continues to do well.  He denies chest pain or shortness of breath.  He did have a chest CTA performed 1/24 revealing his ascending thoracic aneurysm measured 45 mm which was stable.  Abdominal pelvic CTA performed 1/24 revealed his left common iliac artery aneurysm was stable at 29 mm.   Current Meds  Medication Sig   allopurinol (ZYLOPRIM) 100 MG tablet Take 100 mg by mouth daily.   aspirin 81 MG tablet Take 81 mg by mouth  daily.   atorvastatin (LIPITOR) 10 MG tablet TAKE 1 TABLET EVERY DAY   Cholecalciferol (VITAMIN D) 125 MCG (5000 UT) CAPS    losartan-hydrochlorothiazide (HYZAAR) 50-12.5 MG per tablet Take 1 tablet by mouth 2 (two) times daily.      Allergies  Allergen Reactions   Amoxicillin Rash   Fenofibrate Micronized Other (See Comments)   Lisinopril Cough   Minocin [Minocycline] Rash    Social History   Socioeconomic History   Marital status: Married    Spouse name: Not on file   Number of children: Not on file   Years of education: Not on file   Highest education level: Not on file  Occupational History   Not on file  Tobacco Use   Smoking status: Never   Smokeless tobacco: Never  Substance and Sexual Activity   Alcohol use: Not on file   Drug use: Not on file   Sexual activity: Not on file  Other Topics Concern   Not on file  Social History Narrative   Not on file   Social Determinants of Health   Financial Resource Strain: Not on file  Food Insecurity: Not on file  Transportation Needs: Not on file  Physical Activity: Not on file  Stress: Not on file  Social Connections: Not on file  Intimate Partner Violence: Not on file     Review of Systems: General: negative  for chills, fever, night sweats or weight changes.  Cardiovascular: negative for chest pain, dyspnea on exertion, edema, orthopnea, palpitations, paroxysmal nocturnal dyspnea or shortness of breath Dermatological: negative for rash Respiratory: negative for cough or wheezing Urologic: negative for hematuria Abdominal: negative for nausea, vomiting, diarrhea, bright red blood per rectum, melena, or hematemesis Neurologic: negative for visual changes, syncope, or dizziness All other systems reviewed and are otherwise negative except as noted above.    Blood pressure 118/78, pulse 71, height 5\' 11"  (1.803 m), weight 242 lb 3.2 oz (109.9 kg), SpO2 97 %.  General appearance: alert and no distress Neck: no  adenopathy, no carotid bruit, no JVD, supple, symmetrical, trachea midline, and thyroid not enlarged, symmetric, no tenderness/mass/nodules Lungs: clear to auscultation bilaterally Heart: regular rate and rhythm, S1, S2 normal, no murmur, click, rub or gallop Extremities: extremities normal, atraumatic, no cyanosis or edema Pulses: 2+ and symmetric Skin: Skin color, texture, turgor normal. No rashes or lesions Neurologic: Grossly normal  EKG sinus rhythm at 73 with a interventricular conduction delay.  I personally reviewed this EKG.  ASSESSMENT AND PLAN:   Essential hypertension History of essential hypertension with blood pressure measured today at 118/78.  Thoracic aortic aneurysm (HCC) Thoracic aortic aneurysm measuring 45 mm by recent CTA 01/01/2023.  This will be repeated on an annual basis.  Aneurysm of left iliac artery (HCC) History of left iliac aneurysm measuring 2.9 mm by CTA performed 12/30/2022.  This will be repeated on an annual basis.  Hyperlipidemia History of hyperlipidemia on statin therapy with lipid profile performed 09/13/2022 revealing a total cholesterol 151, LDL of 83 and HDL of 48.     Runell Gess MD FACP,FACC,FAHA, [facility] 03/30/2023 3:23 PM

## 2023-03-30 NOTE — Assessment & Plan Note (Signed)
Thoracic aortic aneurysm measuring 45 mm by recent CTA 01/01/2023.  This will be repeated on an annual basis.

## 2023-03-30 NOTE — Addendum Note (Signed)
Addended by: Bernita Buffy on: 03/30/2023 03:29 PM   Modules accepted: Orders

## 2023-03-30 NOTE — Assessment & Plan Note (Signed)
History of hyperlipidemia on statin therapy with lipid profile performed 09/13/2022 revealing a total cholesterol 151, LDL of 83 and HDL of 48.

## 2023-03-30 NOTE — Assessment & Plan Note (Signed)
History of essential hypertension with blood pressure measured today at 118/78.

## 2024-08-19 ENCOUNTER — Telehealth: Payer: Self-pay | Admitting: Cardiovascular Disease

## 2024-08-19 DIAGNOSIS — I723 Aneurysm of iliac artery: Secondary | ICD-10-CM

## 2024-08-19 DIAGNOSIS — E785 Hyperlipidemia, unspecified: Secondary | ICD-10-CM

## 2024-08-19 DIAGNOSIS — E782 Mixed hyperlipidemia: Secondary | ICD-10-CM

## 2024-08-19 DIAGNOSIS — I10 Essential (primary) hypertension: Secondary | ICD-10-CM

## 2024-08-19 DIAGNOSIS — I7121 Aneurysm of the ascending aorta, without rupture: Secondary | ICD-10-CM

## 2024-08-19 NOTE — Telephone Encounter (Signed)
 Patient is scheduled for yearly f/u on 09/29 and wants to know if any imaging needs to be done prior. He states that it was skipped last year and was told he would have it done this year. Please advise.

## 2024-08-19 NOTE — Telephone Encounter (Signed)
 Left voice message to call back 9/16

## 2024-08-20 NOTE — Telephone Encounter (Signed)
 Pt returning call to a nurse

## 2024-08-20 NOTE — Telephone Encounter (Signed)
 Patient calling and would like to know if any images or test need to be before visit on 9/29. Advised patient that provider will order images and lab work if  needed at that scheduled visit. Made patient aware that this will be forward to provider for advice and recommendation and if any recommendations are needed made patient aware he will be notified.

## 2024-08-20 NOTE — Addendum Note (Signed)
 Addended by: TRUDY MIU R on: 08/20/2024 02:09 PM   Modules accepted: Orders

## 2024-08-20 NOTE — Telephone Encounter (Signed)
 Called patient and made him aware that Dr. Court would like for him to have fasting Lipid panel and ct angio chest/abd/ pelvis before visit schedule on 9/29. Orders placed.  Pt verbalized understanding.

## 2024-08-27 ENCOUNTER — Other Ambulatory Visit: Payer: Self-pay | Admitting: *Deleted

## 2024-08-27 DIAGNOSIS — E785 Hyperlipidemia, unspecified: Secondary | ICD-10-CM

## 2024-08-27 NOTE — Progress Notes (Signed)
 Called and made patient aware that appt has been made for 9/25 @9 :45. Made patient aware that he needs to be there 15 minutes earlier for CT/ Angio/ pelvis/ abd and to have lab work done. Understanding verbalized.

## 2024-08-27 NOTE — Progress Notes (Signed)
 Fasting Lipid panel ordered and patient made aware.

## 2024-08-28 ENCOUNTER — Ambulatory Visit (HOSPITAL_COMMUNITY)
Admission: RE | Admit: 2024-08-28 | Discharge: 2024-08-28 | Disposition: A | Discharge status: Home / Self Care | Source: Ambulatory Visit | Attending: Cardiovascular Disease | Admitting: Cardiovascular Disease

## 2024-08-28 DIAGNOSIS — I10 Essential (primary) hypertension: Secondary | ICD-10-CM | POA: Diagnosis not present

## 2024-08-28 DIAGNOSIS — I723 Aneurysm of iliac artery: Secondary | ICD-10-CM | POA: Insufficient documentation

## 2024-08-28 DIAGNOSIS — I7121 Aneurysm of the ascending aorta, without rupture: Secondary | ICD-10-CM | POA: Diagnosis present

## 2024-08-28 LAB — POCT I-STAT CREATININE: Creatinine, Ser: 1.4 mg/dL — ABNORMAL HIGH (ref 0.61–1.24)

## 2024-08-28 MED ORDER — IOHEXOL 350 MG/ML SOLN
100.0000 mL | Freq: Once | INTRAVENOUS | Status: AC | PRN
  Administered 2024-08-28: 100 mL via INTRAVENOUS

## 2024-08-29 ENCOUNTER — Ambulatory Visit: Payer: Self-pay | Admitting: Cardiovascular Disease

## 2024-08-29 LAB — LIPID PANEL
Chol/HDL Ratio: 2.9 ratio (ref 0.0–5.0)
Cholesterol, Total: 150 mg/dL (ref 100–199)
HDL: 52 mg/dL (ref >39)
LDL Chol Calc (NIH): 82 mg/dL (ref 0–99)
Triglycerides: 84 mg/dL (ref 0–149)
VLDL Cholesterol Cal: 16 mg/dL (ref 5–40)

## 2024-09-01 ENCOUNTER — Ambulatory Visit: Payer: Self-pay | Admitting: Cardiovascular Disease

## 2024-09-01 ENCOUNTER — Ambulatory Visit: Attending: Cardiovascular Disease | Admitting: Cardiovascular Disease

## 2024-09-01 ENCOUNTER — Encounter: Payer: Self-pay | Admitting: Cardiovascular Disease

## 2024-09-01 VITALS — BP 110/70 | HR 70 | Ht 70.0 in | Wt 237.0 lb

## 2024-09-01 DIAGNOSIS — I7121 Aneurysm of the ascending aorta, without rupture: Secondary | ICD-10-CM

## 2024-09-01 DIAGNOSIS — E782 Mixed hyperlipidemia: Secondary | ICD-10-CM | POA: Diagnosis not present

## 2024-09-01 DIAGNOSIS — I10 Essential (primary) hypertension: Secondary | ICD-10-CM

## 2024-09-01 NOTE — Assessment & Plan Note (Signed)
 History of essential hypertension with blood pressure measured today at 110/70.  He is on losartan/hydrochlorothiazide.

## 2024-09-01 NOTE — Assessment & Plan Note (Signed)
 History of hyperlipidemia on statin therapy with lipid profile performed 08/28/2024 revealing total cholesterol 150, LDL 82 and HDL 52.

## 2024-09-01 NOTE — Patient Instructions (Signed)
 Medication Instructions:  Your physician recommends that you continue on your current medications as directed. Please refer to the Current Medication list given to you today.  *If you need a refill on your cardiac medications before your next appointment, please call your pharmacy*  Testing/Procedures: Non-Cardiac CT Angiography (CTA) chest/abdomen/pelvis, is a special type of CT scan that uses a computer to produce multi-dimensional views of major blood vessels throughout the body. In CT angiography, a contrast material is injected through an IV to help visualize the blood vessels  **To do in 2 years**   Follow-Up: At [facility], you and your health needs are our priority.  As part of our continuing mission to provide you with exceptional heart care, our providers are all part of one team.  This team includes your primary Cardiologist (physician) and Advanced Practice Providers or APPs (Physician Assistants and Nurse Practitioners) who all work together to provide you with the care you need, when you need it.  Your next appointment:   12 month(s)  Provider:   Dorn Lesches, MD

## 2024-09-01 NOTE — Assessment & Plan Note (Signed)
 History of thoracic aortic aneurysm and left common iliac artery aneurysm measured by CT every 2 years.  This was recently done 09/01/2024 revealing his thoracic aorta was stable at 4.6 cm as was his left common iliac artery at 2.8 cm.  This will be measured again in 24 months.

## 2024-09-01 NOTE — Progress Notes (Signed)
 09/01/2024 Michael Barron   1951/08/02  [id number]  Primary Physician Debrah Josette ORN., PA-C Primary Cardiologist: Dorn JINNY Lesches MD GENI CODY MADEIRA, [place]  HPI:  Michael Barron is a 73 y.o.  mildly overweight married Caucasian male whose wife Wanda Clabe)  is also a patient of mine. I last saw him in the 03/30/2023.  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 .  He has a history of normal coronary arteries by cath in 2008. 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 He had a 2 cm mass arising from the upper pole of his left kidney which was laparoscopically removed by Dr. Renda back in 2008. He also has a history of left common iliac artery aneurysm. The problems include hypertension and hyperlipidemia as well as non-insulin requiring diabetes. He had lost quite a bit overweight and I saw him last as result of diet. He had a stress echo performed at [facility] that showed some vague apical and septal stress-induced wall motion analysis the right do not think these were high-risk findings and he is relatively asymptomatic. He was seen in the emergency room for chest pain after having a large meal.  The thought was that he had gastritis. The chest pain resolved with a GI cocktail. His workup was essentially negative. EKG showed no acute changes. A CT angiogram of his chest showed a stable 4.2 cm thoracic aortic aneurysm. He has had no recurrent symptoms.   Since I saw him 1 a year ago he continues to do well.  He has rare atypical chest pain every 6 to 12 months.  Otherwise he is fairly active.  He did have a CT TAA performed 09/01/2024 revealing a stable ascending thoracic aortic aneurysm measuring 46 mm and left common iliac artery aneurysm measuring 28 mm.   Current Meds  Medication Sig   allopurinol (ZYLOPRIM) 100 MG tablet Take 100 mg by mouth daily.   aspirin 81 MG tablet Take 81 mg by mouth daily.   atorvastatin  (LIPITOR) 10 MG tablet TAKE 1 TABLET EVERY DAY   Cholecalciferol (VITAMIN D) 125 MCG (5000 UT) CAPS    losartan-hydrochlorothiazide (HYZAAR) 50-12.5 MG per tablet Take 1 tablet by mouth 2 (two) times daily.      Allergies  Allergen Reactions   Amoxicillin Rash   Fenofibrate Micronized Other (See Comments)   Lisinopril Cough   Minocin [Minocycline] Rash    Social History   Socioeconomic History   Marital status: Married    Spouse name: Not on file   Number of children: Not on file   Years of education: Not on file   Highest education level: Not on file  Occupational History   Not on file  Tobacco Use   Smoking status: Never   Smokeless tobacco: Never  Substance and Sexual Activity   Alcohol use: Not on file   Drug use: Not on file   Sexual activity: Not on file  Other Topics Concern   Not on file  Social History Narrative   Not on file   Social Drivers of Health   Financial Resource Strain: Low Risk  (08/23/2021)   Received from [facility] visits prior to 02/03/2023.   Overall Financial Resource Strain (CARDIA)    Difficulty of Paying Living Expenses: Not hard at all  Food Insecurity: Low Risk  (09/10/2023)   Received from [facility]   Hunger  Vital Sign    Within the past 12 months, you worried that your food would run out before you got money to buy more: Never true    Within the past 12 months, the food you bought just didn't last and you didn't have money to get more. : Never true  Transportation Needs: No Transportation Needs (09/10/2023)   Received from [facility]    In the past 12 months, has lack of reliable transportation kept you from medical appointments, meetings, work or from getting things needed for daily living? : No  Physical Activity: Unknown (08/23/2021)   Received from [facility] visits prior to 02/03/2023.   Exercise Vital Sign    On average, how many days per week do you engage in  moderate to strenuous exercise (like a brisk walk)?: 0 days    Minutes of Exercise per Session: Not on file  Stress: No Stress Concern Present (08/23/2021)   Received from [facility] visits prior to 02/03/2023.   [employer] of Occupational Health - Occupational Stress Questionnaire    Feeling of Stress : Not at all  Social Connections: Socially Isolated (08/23/2021)   Received from [facility] visits prior to 02/03/2023.   Social Connection and Isolation Panel    In a typical week, how many times do you talk on the phone with family, friends, or neighbors?: Never    How often do you get together with friends or relatives?: Never    How often do you attend church or religious services?: Never    Do you belong to any clubs or organizations such as church groups, unions, fraternal or athletic groups, or school groups?: No    How often do you attend meetings of the clubs or organizations you belong to?: Never    Are you married, widowed, divorced, separated, never married, or living with a partner?: Married  Intimate Partner Violence: Not At Risk (08/23/2021)   Received from [facility] visits prior to 02/03/2023.   Humiliation, Afraid, Rape, and Kick questionnaire    Within the last year, have you been afraid of your partner or ex-partner?: No    Within the last year, have you been humiliated or emotionally abused in other ways by your partner or ex-partner?: No    Within the last year, have you been kicked, hit, slapped, or otherwise physically hurt by your partner or ex-partner?: No    Within the last year, have you been raped or forced to have any kind of sexual activity by your partner or ex-partner?: No     Review of Systems: General: negative for chills, fever, night sweats or weight changes.  Cardiovascular: negative for chest pain, dyspnea on exertion, edema, orthopnea, palpitations, paroxysmal nocturnal dyspnea or  shortness of breath Dermatological: negative for rash Respiratory: negative for cough or wheezing Urologic: negative for hematuria Abdominal: negative for nausea, vomiting, diarrhea, bright red blood per rectum, melena, or hematemesis Neurologic: negative for visual changes, syncope, or dizziness All other systems reviewed and are otherwise negative except as noted above.    Blood pressure 110/70, pulse 70, height 5' 10 (1.778 m), weight 237 lb (107.5 kg), SpO2 95%.  General appearance: alert and no distress Neck: no adenopathy, no carotid bruit, no JVD, supple, symmetrical, trachea midline, and thyroid not enlarged, symmetric, no tenderness/mass/nodules Lungs: clear to auscultation bilaterally Heart: regular rate and rhythm, S1, S2 normal, no murmur, click, rub or  gallop Extremities: extremities normal, atraumatic, no cyanosis or edema Pulses: 2+ and symmetric Skin: Skin color, texture, turgor normal. No rashes or lesions Neurologic: Grossly normal  EKG EKG Interpretation Date/Time:  Monday September 01 2024 08:22:22 EDT Ventricular Rate:  70 PR Interval:  156 QRS Duration:  144 QT Interval:  426 QTC Calculation: 460 R Axis:   -78  Text Interpretation: Sinus rhythm with occasional Premature ventricular complexes Left axis deviation Non-specific intra-ventricular conduction block Minimal voltage criteria for LVH, may be normal variant ( Cornell product ) Cannot rule out Anterior infarct , age undetermined When compared with ECG of 31-May-2016 16:06, Premature ventricular complexes are now Present Premature atrial complexes are no longer Present Confirmed by Court Carrier [phone]) on 09/01/2024 8:30:09 AM    ASSESSMENT AND PLAN:   Essential hypertension History of essential hypertension with blood pressure measured today at 110/70.  He is on losartan/hydrochlorothiazide.  Thoracic aortic aneurysm History of thoracic aortic aneurysm and left common iliac artery aneurysm measured  by CT every 2 years.  This was recently done 09/01/2024 revealing his thoracic aorta was stable at 4.6 cm as was his left common iliac artery at 2.8 cm.  This will be measured again in 24 months.  Hyperlipidemia History of hyperlipidemia on statin therapy with lipid profile performed 08/28/2024 revealing total cholesterol 150, LDL 82 and HDL 52.     Carrier DOROTHA Court MD [facility], [facility] 09/01/2024 8:42 AM

## 2024-09-18 ENCOUNTER — Telehealth: Payer: Self-pay

## 2024-09-18 NOTE — Telephone Encounter (Signed)
   Pre-operative Risk Assessment    Patient Name: Michael Barron  DOB: 03-26-1951 MRN: [id number]   Date of last office visit: 09/01/24 DORN LESCHES, MD Date of next office visit: NONE   Request for Surgical Clearance    Procedure:  VITRECTOMY, MEMBRANE PEEL, GAS FLUID EXCHANGE OF THE LEFT EYE  Date of Surgery:  Clearance 09/25/24                                Surgeon:  DR ERIC [facility] Surgeon's Group or Practice Name:  [facility], [facility] Phone number:  [phone] Fax number:  [phone]   Type of Clearance Requested:   - Medical  - Pharmacy:  Hold Aspirin     Type of Anesthesia:  Not Indicated   Additional requests/questions:    SignedLucie DELENA Ku   09/18/2024, 4:38 PM

## 2024-09-19 NOTE — Telephone Encounter (Signed)
 Dr. Court,  You saw this patient on 09/01/2024. Per protocol we request that you comment on his cardiac risk to proceed with VITRECTOMY, MEMBRANE PEEL, GAS FLUID EXCHANGE OF THE LEFT EYE and holding ASA scheduled for 09/25/2024, since it has been less than 2 months since evaluated in the office. Please send your comment to P CV Pre-Op Pool.  Thank you, Lamarr Satterfield DNP, ANP, AACC.

## 2024-09-19 NOTE — Telephone Encounter (Signed)
   Patient Name: Michael Barron  DOB: 1951/03/17 MRN: [id number]  Primary Cardiologist: None  Chart reviewed as part of pre-operative protocol coverage. Given past medical history and time since last visit, based on ACC/AHA guidelines, Harkirat N Sword is at acceptable risk for the planned procedure without further cardiovascular testing.   Per office protocol, if patient is without any new symptoms or concerns at the time of their virtual visit, he may hold Aspirin for 7 days prior to procedure. Please resume ASA as soon as possible postprocedure, at the discretion of the surgeon.    The patient was advised that if he develops new symptoms prior to surgery to contact our office to arrange for a follow-up visit, and he verbalized understanding.  I will route this recommendation to the requesting party via Epic fax function and remove from pre-op pool.  Please call with questions.  Lamarr Satterfield, NP 09/19/2024, 11:43 AM
